# Patient Record
Sex: Male | Born: 1935 | Race: White | Hispanic: No | Marital: Married | Smoking: Former smoker
Health system: Southern US, Community
[De-identification: ages and names within clinical notes are randomized; demographics above are authoritative.]

## PROBLEM LIST (undated history)

## (undated) DIAGNOSIS — I4891 Unspecified atrial fibrillation: Secondary | ICD-10-CM

## (undated) DIAGNOSIS — F0390 Unspecified dementia without behavioral disturbance: Secondary | ICD-10-CM

## (undated) HISTORY — PX: SHOULDER SURGERY: SHX246

---

## 2004-09-25 ENCOUNTER — Ambulatory Visit: Payer: Self-pay | Admitting: Internal Medicine

## 2006-01-09 ENCOUNTER — Ambulatory Visit: Payer: Self-pay | Admitting: Unknown Physician Specialty

## 2006-10-01 ENCOUNTER — Ambulatory Visit: Payer: Self-pay | Admitting: Internal Medicine

## 2009-01-16 ENCOUNTER — Ambulatory Visit: Payer: Self-pay | Admitting: Orthopedic Surgery

## 2009-10-08 ENCOUNTER — Emergency Department: Payer: Self-pay | Admitting: Emergency Medicine

## 2010-03-16 ENCOUNTER — Emergency Department: Payer: Self-pay | Admitting: Emergency Medicine

## 2010-04-22 ENCOUNTER — Ambulatory Visit: Payer: Self-pay | Admitting: Unknown Physician Specialty

## 2010-05-15 ENCOUNTER — Encounter: Payer: Self-pay | Admitting: Unknown Physician Specialty

## 2010-05-31 ENCOUNTER — Ambulatory Visit: Payer: Self-pay | Admitting: Unknown Physician Specialty

## 2010-06-04 ENCOUNTER — Ambulatory Visit: Payer: Self-pay | Admitting: Unknown Physician Specialty

## 2010-06-06 ENCOUNTER — Encounter: Payer: Self-pay | Admitting: Internal Medicine

## 2010-06-07 ENCOUNTER — Ambulatory Visit: Payer: Self-pay | Admitting: Cardiovascular Disease

## 2010-06-13 ENCOUNTER — Encounter: Payer: Self-pay | Admitting: Internal Medicine

## 2010-07-13 ENCOUNTER — Encounter: Payer: Self-pay | Admitting: Internal Medicine

## 2010-08-13 ENCOUNTER — Encounter: Payer: Self-pay | Admitting: Internal Medicine

## 2010-08-26 ENCOUNTER — Encounter: Payer: Self-pay | Admitting: Unknown Physician Specialty

## 2010-09-12 ENCOUNTER — Encounter: Payer: Self-pay | Admitting: Unknown Physician Specialty

## 2010-10-13 ENCOUNTER — Encounter: Payer: Self-pay | Admitting: Unknown Physician Specialty

## 2010-11-13 ENCOUNTER — Encounter: Payer: Self-pay | Admitting: Unknown Physician Specialty

## 2010-12-12 ENCOUNTER — Encounter: Payer: Self-pay | Admitting: Unknown Physician Specialty

## 2011-01-12 ENCOUNTER — Encounter: Payer: Self-pay | Admitting: Unknown Physician Specialty

## 2011-02-11 ENCOUNTER — Encounter: Payer: Self-pay | Admitting: Unknown Physician Specialty

## 2011-03-14 ENCOUNTER — Encounter: Payer: Self-pay | Admitting: Unknown Physician Specialty

## 2011-04-13 ENCOUNTER — Encounter: Payer: Self-pay | Admitting: Unknown Physician Specialty

## 2011-05-14 ENCOUNTER — Encounter: Payer: Self-pay | Admitting: Unknown Physician Specialty

## 2011-06-23 ENCOUNTER — Ambulatory Visit: Payer: Self-pay | Admitting: Unknown Physician Specialty

## 2011-06-24 LAB — PATHOLOGY REPORT

## 2012-08-19 ENCOUNTER — Emergency Department: Payer: Self-pay | Admitting: Emergency Medicine

## 2012-08-19 LAB — CBC
HCT: 36.1 % — ABNORMAL LOW (ref 40.0–52.0)
MCHC: 35.8 g/dL (ref 32.0–36.0)
Platelet: 212 10*3/uL (ref 150–440)
RDW: 13.5 % (ref 11.5–14.5)
WBC: 9.7 10*3/uL (ref 3.8–10.6)

## 2012-08-19 LAB — URINALYSIS, COMPLETE
Bacteria: NONE SEEN
Ketone: NEGATIVE
Nitrite: NEGATIVE
Ph: 6 (ref 4.5–8.0)
Protein: NEGATIVE
RBC,UR: 1 /HPF (ref 0–5)
WBC UR: <1 /HPF (ref 0–5)

## 2012-08-19 LAB — COMPREHENSIVE METABOLIC PANEL
Albumin: 4 g/dL (ref 3.4–5.0)
Alkaline Phosphatase: 73 U/L (ref 50–136)
Calcium, Total: 8.8 mg/dL (ref 8.5–10.1)
Chloride: 105 mmol/L (ref 98–107)
Creatinine: 1.68 mg/dL — ABNORMAL HIGH (ref 0.60–1.30)
EGFR (African American): 43 — ABNORMAL LOW
Glucose: 101 mg/dL — ABNORMAL HIGH (ref 65–99)
SGOT(AST): 21 U/L (ref 15–37)
Sodium: 142 mmol/L (ref 136–145)
Total Protein: 7 g/dL (ref 6.4–8.2)

## 2012-08-19 LAB — TROPONIN I: Troponin-I: <0.02 ng/mL

## 2012-08-19 LAB — CK TOTAL AND CKMB (NOT AT ARMC): CK-MB: 0.8 ng/mL (ref 0.5–3.6)

## 2012-08-19 LAB — APTT: Activated PTT: 28.9 secs (ref 23.6–35.9)

## 2012-08-26 ENCOUNTER — Encounter: Payer: Self-pay | Admitting: Internal Medicine

## 2012-09-12 ENCOUNTER — Encounter: Payer: Self-pay | Admitting: Internal Medicine

## 2012-10-13 ENCOUNTER — Encounter: Payer: Self-pay | Admitting: Internal Medicine

## 2012-10-26 ENCOUNTER — Encounter: Payer: Self-pay | Admitting: Internal Medicine

## 2012-11-13 ENCOUNTER — Encounter: Payer: Self-pay | Admitting: Internal Medicine

## 2013-06-14 ENCOUNTER — Emergency Department: Payer: Self-pay | Admitting: Emergency Medicine

## 2013-06-14 LAB — COMPREHENSIVE METABOLIC PANEL
Albumin: 3.7 g/dL (ref 3.4–5.0)
Anion Gap: 4 — ABNORMAL LOW (ref 7–16)
BUN: 21 mg/dL — ABNORMAL HIGH (ref 7–18)
Calcium, Total: 9.4 mg/dL (ref 8.5–10.1)
EGFR (African American): 44 — ABNORMAL LOW
Glucose: 96 mg/dL (ref 65–99)
Osmolality: 277 (ref 275–301)
Potassium: 4.2 mmol/L (ref 3.5–5.1)
SGOT(AST): 24 U/L (ref 15–37)
SGPT (ALT): 21 U/L (ref 12–78)
Sodium: 137 mmol/L (ref 136–145)
Total Protein: 7 g/dL (ref 6.4–8.2)

## 2013-06-14 LAB — CBC
HCT: 39.4 % — ABNORMAL LOW (ref 40.0–52.0)
HGB: 13.8 g/dL (ref 13.0–18.0)
MCH: 31.6 pg (ref 26.0–34.0)
MCHC: 35.1 g/dL (ref 32.0–36.0)
RBC: 4.37 10*6/uL — ABNORMAL LOW (ref 4.40–5.90)
WBC: 9.2 10*3/uL (ref 3.8–10.6)

## 2013-06-14 LAB — URINALYSIS, COMPLETE
Bacteria: NONE SEEN
Bilirubin,UR: NEGATIVE
Glucose,UR: NEGATIVE mg/dL (ref 0–75)
Leukocyte Esterase: NEGATIVE
Nitrite: NEGATIVE
Ph: 8 (ref 4.5–8.0)
Protein: NEGATIVE
RBC,UR: 1 /HPF (ref 0–5)
Squamous Epithelial: NONE SEEN

## 2013-06-14 LAB — LIPASE, BLOOD: Lipase: 113 U/L (ref 73–393)

## 2013-06-27 ENCOUNTER — Other Ambulatory Visit: Payer: Self-pay | Admitting: Unknown Physician Specialty

## 2013-06-27 LAB — CLOSTRIDIUM DIFFICILE BY PCR

## 2013-07-15 ENCOUNTER — Other Ambulatory Visit: Payer: Self-pay | Admitting: Unknown Physician Specialty

## 2013-07-15 LAB — CLOSTRIDIUM DIFFICILE BY PCR

## 2014-08-23 ENCOUNTER — Emergency Department: Payer: Self-pay | Admitting: Emergency Medicine

## 2014-08-23 LAB — CBC WITH DIFFERENTIAL/PLATELET
Basophil #: 0 10*3/uL (ref 0.0–0.1)
Basophil %: 0.4 %
EOS PCT: 3.7 %
Eosinophil #: 0.3 10*3/uL (ref 0.0–0.7)
HCT: 38.5 % — ABNORMAL LOW (ref 40.0–52.0)
HGB: 12.7 g/dL — AB (ref 13.0–18.0)
LYMPHS PCT: 17.3 %
Lymphocyte #: 1.2 10*3/uL (ref 1.0–3.6)
MCH: 30.9 pg (ref 26.0–34.0)
MCHC: 32.9 g/dL (ref 32.0–36.0)
MCV: 94 fL (ref 80–100)
Monocyte #: 0.8 x10 3/mm (ref 0.2–1.0)
Monocyte %: 12.5 %
NEUTROS PCT: 66.1 %
Neutrophil #: 4.5 10*3/uL (ref 1.4–6.5)
PLATELETS: 176 10*3/uL (ref 150–440)
RBC: 4.1 10*6/uL — ABNORMAL LOW (ref 4.40–5.90)
RDW: 14.2 % (ref 11.5–14.5)
WBC: 6.8 10*3/uL (ref 3.8–10.6)

## 2014-08-23 LAB — URINALYSIS, COMPLETE
BILIRUBIN, UR: NEGATIVE
Bacteria: NONE SEEN
Blood: NEGATIVE
Glucose,UR: NEGATIVE mg/dL (ref 0–75)
KETONE: NEGATIVE
Leukocyte Esterase: NEGATIVE
Nitrite: NEGATIVE
Ph: 6 (ref 4.5–8.0)
Protein: NEGATIVE
RBC,UR: NONE SEEN /HPF (ref 0–5)
SPECIFIC GRAVITY: 1.019 (ref 1.003–1.030)
SQUAMOUS EPITHELIAL: NONE SEEN
WBC UR: <1 /HPF (ref 0–5)

## 2014-08-23 LAB — BASIC METABOLIC PANEL
ANION GAP: 6 — AB (ref 7–16)
BUN: 18 mg/dL (ref 7–18)
Calcium, Total: 8.1 mg/dL — ABNORMAL LOW (ref 8.5–10.1)
Chloride: 106 mmol/L (ref 98–107)
Co2: 28 mmol/L (ref 21–32)
Creatinine: 1.71 mg/dL — ABNORMAL HIGH (ref 0.60–1.30)
GFR CALC AF AMER: 49 — AB
GFR CALC NON AF AMER: 41 — AB
Glucose: 97 mg/dL (ref 65–99)
OSMOLALITY: 281 (ref 275–301)
Potassium: 4.5 mmol/L (ref 3.5–5.1)
Sodium: 140 mmol/L (ref 136–145)

## 2014-08-23 LAB — TROPONIN I

## 2015-03-19 DIAGNOSIS — I10 Essential (primary) hypertension: Secondary | ICD-10-CM | POA: Diagnosis present

## 2015-06-21 DIAGNOSIS — N183 Chronic kidney disease, stage 3 unspecified: Secondary | ICD-10-CM | POA: Diagnosis present

## 2016-02-01 ENCOUNTER — Other Ambulatory Visit: Payer: Self-pay | Admitting: Physician Assistant

## 2016-02-01 DIAGNOSIS — J189 Pneumonia, unspecified organism: Secondary | ICD-10-CM

## 2016-02-01 DIAGNOSIS — R9389 Abnormal findings on diagnostic imaging of other specified body structures: Secondary | ICD-10-CM

## 2016-07-15 ENCOUNTER — Encounter: Payer: Medicare Other | Attending: Physician Assistant | Admitting: Physician Assistant

## 2016-07-15 DIAGNOSIS — N183 Chronic kidney disease, stage 3 (moderate): Secondary | ICD-10-CM | POA: Insufficient documentation

## 2016-07-15 DIAGNOSIS — L89312 Pressure ulcer of right buttock, stage 2: Secondary | ICD-10-CM | POA: Insufficient documentation

## 2016-07-15 DIAGNOSIS — M1990 Unspecified osteoarthritis, unspecified site: Secondary | ICD-10-CM | POA: Insufficient documentation

## 2016-07-15 DIAGNOSIS — I129 Hypertensive chronic kidney disease with stage 1 through stage 4 chronic kidney disease, or unspecified chronic kidney disease: Secondary | ICD-10-CM | POA: Diagnosis not present

## 2016-07-15 DIAGNOSIS — L89322 Pressure ulcer of left buttock, stage 2: Secondary | ICD-10-CM | POA: Insufficient documentation

## 2016-07-18 NOTE — Progress Notes (Addendum)
TREVYON, SWOR ([id number]) Visit Report for 07/15/2016 Allergy List Details Patient Name: Mike Smith, Mike Smith. Date of Service: 07/15/2016 9:30 AM Medical Record Number: [id number] Patient Account Number: [id number] Date of Birth/Sex: 1928/01/05 (82 y.o. Male) Treating RN: Cornell Barman Primary Care Physician: Lynett Fish Other Clinician: Referring Physician: Lynett Fish Treating Physician/Extender: Melburn Hake, HOYT Weeks in Treatment: 0 Allergies Active Allergies Sulfa (Sulfonamide Antibiotics) Allergy Notes Electronic Signature(s) Signed: 07/22/2016 1:46:02 PM By: Gretta Cool, RN, BSN, Kim RN, BSN Previous Signature: 07/17/2016 4:28:52 PM Version By: Gretta Cool, RN, BSN, Kim RN, BSN Entered By: Gretta Cool, RN, BSN, Kim on 07/22/2016 13:26:39 Mike Smith ([id number]) -------------------------------------------------------------------------------- Arrival Information Details Patient Name: Mike Smith, Mike Smith. Date of Service: 07/15/2016 9:30 AM Medical Record Number: [id number] Patient Account Number: [id number] Date of Birth/Sex: May 25, 1928 (82 y.o. Male) Treating RN: Cornell Barman Primary Care Physician: Lynett Fish Other Clinician: Referring Physician: Lynett Fish Treating Physician/Extender: Melburn Hake, HOYT Weeks in Treatment: 0 Visit Information Patient Arrived: Lyndel Pleasure Time: 09:39 Accompanied By: son, Transfer Assistance: None Patient Identification Verified: Yes Secondary Verification Process Yes Completed: Patient Has Alerts: Yes Patient Alerts: NOT Diabetic Electronic Signature(s) Signed: 07/22/2016 1:46:02 PM By: Gretta Cool, RN, BSN, Kim RN, BSN Previous Signature: 07/17/2016 4:28:52 PM Version By: Gretta Cool, RN, BSN, Kim RN, BSN Entered By: Gretta Cool, RN, BSN, Kim on 07/22/2016 13:25:57 Mike Smith ([id number]) -------------------------------------------------------------------------------- Clinic Level of Care Assessment Details Patient Name:  Mike Smith, Mike Smith. Date of Service: 07/15/2016 9:30 AM Medical Record Number: [id number] Patient Account Number: [id number] Date of Birth/Sex: [birth year] (82 y.o. Male) Treating RN: Cornell Barman Primary Care Physician: Lynett Fish Other Clinician: Referring Physician: Lynett Fish Treating Physician/Extender: Melburn Hake, HOYT Weeks in Treatment: 0 Clinic Level of Care Assessment Items TOOL 2 Quantity Score '[]'$  - Use when only an EandM is performed on the INITIAL visit 0 ASSESSMENTS - Nursing Assessment / Reassessment X - General Physical Exam (combine w/ comprehensive assessment (listed just 1 20 below) when performed on new pt. evals) X - Comprehensive Assessment (HX, ROS, Risk Assessments, Wounds Hx, etc.) 1 25 ASSESSMENTS - Wound and Skin Assessment / Reassessment X - Simple Wound Assessment / Reassessment - one wound 1 5 '[]'$  - Complex Wound Assessment / Reassessment - multiple wounds 0 '[]'$  - Dermatologic / Skin Assessment (not related to wound area) 0 ASSESSMENTS - Ostomy and/or Continence Assessment and Care '[]'$  - Incontinence Assessment and Management 0 '[]'$  - Ostomy Care Assessment and Management (repouching, etc.) 0 PROCESS - Coordination of Care X - Simple Patient / Family Education for ongoing care 1 15 '[]'$  - Complex (extensive) Patient / Family Education for ongoing care 0 X - Staff obtains Programmer, systems, Records, Test Results / Process Orders 1 10 '[]'$  - Staff telephones HHA, Nursing Homes / Clarify orders / etc 0 '[]'$  - Routine Transfer to another Facility (non-emergent condition) 0 '[]'$  - Routine Hospital Admission (non-emergent condition) 0 '[]'$  - New Admissions / Biomedical engineer / Ordering NPWT, Apligraf, etc. 0 '[]'$  - Emergency Hospital Admission (emergent condition) 0 X - Simple Discharge Coordination 1 10 ORLANDIS, SANDEN. ([id number]) '[]'$  - Complex (extensive) Discharge Coordination 0 PROCESS - Special Needs '[]'$  - Pediatric / Minor Patient Management 0 '[]'$  -  Isolation Patient Management 0 '[]'$  - Hearing / Language / Visual special needs 0 '[]'$  - Assessment of Community assistance (transportation, D/C planning, etc.) 0 '[]'$  - Additional assistance / Altered mentation 0 '[]'$  - Support Surface(s) Assessment (bed, cushion, seat, etc.) 0 INTERVENTIONS - Wound  Cleansing / Measurement X - Wound Imaging (photographs - any number of wounds) 1 5 '[]'$  - Wound Tracing (instead of photographs) 0 '[]'$  - Simple Wound Measurement - one wound 0 X - Complex Wound Measurement - multiple wounds 2 5 '[]'$  - Simple Wound Cleansing - one wound 0 X - Complex Wound Cleansing - multiple wounds 2 5 INTERVENTIONS - Wound Dressings '[]'$  - Small Wound Dressing one or multiple wounds 0 X - Medium Wound Dressing one or multiple wounds 2 15 '[]'$  - Large Wound Dressing one or multiple wounds 0 '[]'$  - Application of Medications - injection 0 INTERVENTIONS - Miscellaneous '[]'$  - External ear exam 0 '[]'$  - Specimen Collection (cultures, biopsies, blood, body fluids, etc.) 0 '[]'$  - Specimen(s) / Culture(s) sent or taken to Lab for analysis 0 '[]'$  - Patient Transfer (multiple staff / Civil Service fast streamer / Similar devices) 0 '[]'$  - Simple Staple / Suture removal (25 or less) 0 '[]'$  - Complex Staple / Suture removal (26 or more) 0 KARANVEER, RAMAKRISHNAN M. ([id number]) '[]'$  - Hypo / Hyperglycemic Management (close monitor of Blood Glucose) 0 '[]'$  - Ankle / Brachial Index (ABI) - do not check if billed separately 0 Has the patient been seen at the hospital within the last three years: Yes Total Score: 140 Level Of Care: New/Established - Level 4 Electronic Signature(s) Signed: 07/17/2016 4:28:52 PM By: Gretta Cool, RN, BSN, Kim RN, BSN Entered By: Gretta Cool, RN, BSN, Kim on 07/15/2016 10:23:51 Mike Smith ([id number]) -------------------------------------------------------------------------------- Encounter Discharge Information Details Patient Name: Mike Smith, Mike Smith. Date of Service: 07/15/2016 9:30 AM Medical Record Number:  [id number] Patient Account Number: [id number] Date of Birth/Sex: 1928-04-18 (82 y.o. Male) Treating RN: Cornell Barman Primary Care Physician: Lynett Fish Other Clinician: Referring Physician: Lynett Fish Treating Physician/Extender: Melburn Hake, HOYT Weeks in Treatment: 0 Encounter Discharge Information Items Discharge Pain Level: 0 Discharge Condition: Stable Ambulatory Status: Cane Discharge Destination: Home Transportation: Other Accompanied By: son Schedule Follow-up Appointment: Yes Medication Reconciliation completed Yes and provided to Patient/Care Christle Nolting: Provided on Clinical Summary of Care: 07/15/2016 Form Type Recipient Paper Patient DF Electronic Signature(s) Signed: 07/15/2016 10:37:28 AM By: Ruthine Dose Entered By: Ruthine Dose on 07/15/2016 10:37:27 Mike Smith ([id number]) -------------------------------------------------------------------------------- Multi Wound Chart Details Patient Name: Mike Smith. Date of Service: 07/15/2016 9:30 AM Medical Record Number: [id number] Patient Account Number: [id number] Date of Birth/Sex: September 27, 1928 (82 y.o. Male) Treating RN: Cornell Barman Primary Care Physician: Lynett Fish Other Clinician: Referring Physician: Lynett Fish Treating Physician/Extender: Melburn Hake, HOYT Weeks in Treatment: 0 Vital Signs Height(in): 72 Pulse(bpm): 79 Weight(lbs): 224 Blood Pressure 125/69 (mmHg): Body Mass Index(BMI): 30 Temperature(F): 98.2 Respiratory Rate 16 (breaths/min): Photos: [N/A:N/A] Wound Location: Right Gluteus Left Gluteus N/A Wounding Event: Gradually Appeared Pressure Injury N/A Primary Etiology: Pressure Ulcer Pressure Ulcer N/A Comorbid History: Cataracts, Osteoarthritis Cataracts, Osteoarthritis N/A Date Acquired: 02/25/2016 02/25/2016 N/A Weeks of Treatment: 0 0 N/A Wound Status: Open Open N/A Measurements L x W x D 0.6x0.5x0.1 2x1x0.1 N/A (cm) Area (cm) : 0.236 1.571  N/A Volume (cm) : 0.024 0.157 N/A % Reduction in Area: 0.00% 0.00% N/A % Reduction in Volume: 0.00% 0.00% N/A Classification: Category/Stage II Category/Stage II N/A Exudate Amount: Small Small N/A Exudate Type: Serosanguineous Serous N/A Exudate Color: red, brown amber N/A Wound Margin: Flat and Intact Flat and Intact N/A Granulation Amount: Small (1-33%) Small (1-33%) N/A Granulation Quality: Pink Pink N/A Necrotic Amount: Large (67-100%) Large (67-100%) N/A Necrotic Tissue: N/A Eschar N/A Exposed Structures: Fascia: No  Fascia: No N/A Fat: No Fat: No Mike Smith, Mike Smith ([id number]) Tendon: No Tendon: No Muscle: No Muscle: No Joint: No Joint: No Bone: No Bone: No Limited to Skin Limited to Skin Breakdown Breakdown Epithelialization: N/A Small (1-33%) N/A Periwound Skin Texture: Scarring: Yes Edema: No N/A Edema: No Excoriation: No Excoriation: No Induration: No Induration: No Callus: No Callus: No Crepitus: No Crepitus: No Fluctuance: No Fluctuance: No Friable: No Friable: No Rash: No Rash: No Scarring: No Periwound Skin Maceration: No Maceration: No N/A Moisture: Moist: No Moist: No Dry/Scaly: No Dry/Scaly: No Periwound Skin Color: Atrophie Blanche: No Atrophie Blanche: No N/A Cyanosis: No Cyanosis: No Ecchymosis: No Ecchymosis: No Erythema: No Erythema: No Hemosiderin Staining: No Hemosiderin Staining: No Mottled: No Mottled: No Pallor: No Pallor: No Rubor: No Rubor: No Tenderness on No No N/A Palpation: Wound Preparation: Ulcer Cleansing: Ulcer Cleansing: N/A Rinsed/Irrigated with Rinsed/Irrigated with Saline Saline Topical Anesthetic Topical Anesthetic Applied: None Applied: None Treatment Notes Electronic Signature(s) Signed: 07/17/2016 4:28:52 PM By: Gretta Cool, RN, BSN, Kim RN, BSN Entered By: Gretta Cool, RN, BSN, Kim on 07/15/2016 10:17:57 Mike Smith  ([id number]) -------------------------------------------------------------------------------- [place] Details Patient Name: Mike Smith, Mike Smith. Date of Service: 07/15/2016 9:30 AM Medical Record Number: [id number] Patient Account Number: [id number] Date of Birth/Sex: 1927/11/19 (82 y.o. Male) Treating RN: Cornell Barman Primary Care Physician: Lynett Fish Other Clinician: Referring Physician: Lynett Fish Treating Physician/Extender: Melburn Hake, HOYT Weeks in Treatment: 0 Active Inactive Abuse / Safety / Falls / Self Care Management Nursing Diagnoses: Impaired physical mobility Potential for falls Goals: Patient will remain injury free Date Initiated: 07/15/2016 Goal Status: Active Interventions: Assess fall risk on admission and as needed Notes: Orientation to the Wound Care Program Nursing Diagnoses: Knowledge deficit related to the wound healing center program Goals: Patient/caregiver will verbalize understanding of the [place] Program Date Initiated: 07/15/2016 Goal Status: Active Interventions: Provide education on orientation to the wound center Notes: Pressure Nursing Diagnoses: Knowledge deficit related to management of pressures ulcers Potential for impaired tissue integrity related to pressure, friction, moisture, and shear Goals: LEONIDUS, ROWAND ([id number]) Patient will remain free of pressure ulcers Date Initiated: 07/15/2016 Goal Status: Active Interventions: Assess: immobility, friction, shearing, incontinence upon admission and as needed Notes: Wound/Skin Impairment Nursing Diagnoses: Impaired tissue integrity Goals: Patient/caregiver will verbalize understanding of skin care regimen Date Initiated: 07/15/2016 Goal Status: Active Interventions: Assess patient/caregiver ability to obtain necessary supplies Notes: Electronic Signature(s) Signed: 07/17/2016 4:28:52 PM By: Gretta Cool, RN, BSN, Kim RN, BSN Entered By:  Gretta Cool, RN, BSN, Kim on 07/15/2016 10:17:41 Mike Smith ([id number]) -------------------------------------------------------------------------------- Pain Assessment Details Patient Name: Mike Smith, Mike Smith. Date of Service: 07/15/2016 9:30 AM Medical Record Number: [id number] Patient Account Number: [id number] Date of Birth/Sex: July 17, 1928 (82 y.o. Male) Treating RN: Cornell Barman Primary Care Physician: Lynett Fish Other Clinician: Referring Physician: Lynett Fish Treating Physician/Extender: Melburn Hake, HOYT Weeks in Treatment: 0 Active Problems Location of Pain Severity and Description of Pain Patient Has Paino No Site Locations With Dressing Change: No Duration of the Pain. Constant / Intermittento Intermittent Pain Management and Medication Current Pain Management: Electronic Signature(s) Signed: 07/22/2016 1:46:02 PM By: Gretta Cool, RN, BSN, Kim RN, BSN Previous Signature: 07/17/2016 4:28:52 PM Version By: Gretta Cool, RN, BSN, Kim RN, BSN Entered By: Gretta Cool, RN, BSN, Kim on 07/22/2016 13:26:04 Mike Smith ([id number]) -------------------------------------------------------------------------------- Patient/Caregiver Education Details Patient Name: LOMAX, Mike Smith. Date of Service: 07/15/2016 9:30 AM Medical Record Number: [id number] Patient Account Number: [id number] Date of  Birth/Gender: [birth year] (83 y.o. Male) Treating RN: Cornell Barman Primary Care Physician: Lynett Fish Other Clinician: Referring Physician: Lynett Fish Treating Physician/Extender: Sharalyn Ink in Treatment: 0 Education Assessment Education Provided To: Patient Education Topics Provided Offloading: Handouts: What is Offloadingo Methods: Demonstration Responses: State content correctly Wound/Skin Impairment: Handouts: Caring for Your Ulcer Methods: Demonstration Responses: State content correctly Electronic Signature(s) Signed: 07/17/2016 4:28:52 PM By: Gretta Cool, RN,  BSN, Kim RN, BSN Entered By: Gretta Cool, RN, BSN, Kim on 07/15/2016 10:25:12 Mike Smith ([id number]) -------------------------------------------------------------------------------- Wound Assessment Details Patient Name: Mike Smith, Mike Smith. Date of Service: 07/15/2016 9:30 AM Medical Record Number: [id number] Patient Account Number: [id number] Date of Birth/Sex: 03-Jan-1928 (81 y.o. Male) Treating RN: Cornell Barman Primary Care Physician: Lynett Fish Other Clinician: Referring Physician: Lynett Fish Treating Physician/Extender: Melburn Hake, HOYT Weeks in Treatment: 0 Wound Status Wound Number: 1 Primary Etiology: Pressure Ulcer Wound Location: Right Gluteus Wound Status: Open Wounding Event: Gradually Appeared Comorbid History: Cataracts, Osteoarthritis Date Acquired: 02/25/2016 Weeks Of Treatment: 0 Clustered Wound: No Photos Wound Measurements Length: (cm) 0.6 Width: (cm) 0.5 Depth: (cm) 0.1 Area: (cm) 0.236 Volume: (cm) 0.024 % Reduction in Area: 0% % Reduction in Volume: 0% Wound Description Classification: Category/Stage II Wound Margin: Flat and Intact Exudate Amount: Medium Exudate Type: Serosanguineous Exudate Color: red, brown Wound Bed Granulation Amount: Small (1-33%) Exposed Structure Granulation Quality: Pink Fascia Exposed: No Necrotic Amount: Large (67-100%) Fat Layer Exposed: No Tendon Exposed: No Muscle Exposed: No Joint Exposed: No Bone Exposed: No Mike Smith, Mike Smith ([id number]) Limited to Skin Breakdown Periwound Skin Texture Texture Color No Abnormalities Noted: No No Abnormalities Noted: No Callus: No Atrophie Blanche: No Crepitus: No Cyanosis: No Excoriation: No Ecchymosis: No Fluctuance: No Erythema: No Friable: No Hemosiderin Staining: No Induration: No Mottled: No Localized Edema: No Pallor: No Rash: No Rubor: No Scarring: Yes Moisture No Abnormalities Noted: No Dry / Scaly: No Maceration: No Moist:  No Wound Preparation Ulcer Cleansing: Rinsed/Irrigated with Saline Topical Anesthetic Applied: None Treatment Notes Wound #1 (Right Gluteus) 1. Cleansed with: Clean wound with Normal Saline 2. Anesthetic Topical Lidocaine 4% cream to wound bed prior to debridement 4. Dressing Applied: Hydrogel 5. Secondary Dressing Applied Bordered Foam Dressing Electronic Signature(s) Signed: 07/17/2016 4:28:52 PM By: Gretta Cool, RN, BSN, Kim RN, BSN Entered By: Gretta Cool, RN, BSN, Kim on 07/17/2016 11:44:17 Mike Smith ([id number]) -------------------------------------------------------------------------------- Wound Assessment Details Patient Name: Mike Smith, Mike Smith. Date of Service: 07/15/2016 9:30 AM Medical Record Number: [id number] Patient Account Number: [id number] Date of Birth/Sex: May 01, 1928 (82 y.o. Male) Treating RN: Cornell Barman Primary Care Physician: Lynett Fish Other Clinician: Referring Physician: Lynett Fish Treating Physician/Extender: Melburn Hake, HOYT Weeks in Treatment: 0 Wound Status Wound Number: 2 Primary Etiology: Pressure Ulcer Wound Location: Left Gluteus Wound Status: Open Wounding Event: Pressure Injury Comorbid History: Cataracts, Osteoarthritis Date Acquired: 02/25/2016 Weeks Of Treatment: 0 Clustered Wound: No Photos Wound Measurements Length: (cm) 2 Width: (cm) 1 Depth: (cm) 0.1 Area: (cm) 1.571 Volume: (cm) 0.157 % Reduction in Area: 0% % Reduction in Volume: 0% Epithelialization: Small (1-33%) Tunneling: No Undermining: No Wound Description Classification: Category/Stage II Wound Margin: Flat and Intact Exudate Amount: Small Exudate Type: Serous Exudate Color: amber Wound Bed Granulation Amount: Small (1-33%) Exposed Structure Granulation Quality: Pink Fascia Exposed: No Necrotic Amount: Large (67-100%) Fat Layer Exposed: No Necrotic Quality: Eschar Tendon Exposed: No Muscle Exposed: No Joint Exposed: No Bone Exposed:  No PRANAY, Mike Smith ([id number]) Limited to Skin Breakdown Periwound  Skin Texture Texture Color No Abnormalities Noted: No No Abnormalities Noted: No Callus: No Atrophie Blanche: No Crepitus: No Cyanosis: No Excoriation: No Ecchymosis: No Fluctuance: No Erythema: No Friable: No Hemosiderin Staining: No Induration: No Mottled: No Localized Edema: No Pallor: No Rash: No Rubor: No Scarring: No Moisture No Abnormalities Noted: No Dry / Scaly: No Maceration: No Moist: No Wound Preparation Ulcer Cleansing: Rinsed/Irrigated with Saline Topical Anesthetic Applied: None Treatment Notes Wound #2 (Left Gluteus) 1. Cleansed with: Clean wound with Normal Saline 2. Anesthetic Topical Lidocaine 4% cream to wound bed prior to debridement 4. Dressing Applied: Hydrogel 5. Secondary Dressing Applied Bordered Foam Dressing Electronic Signature(s) Signed: 07/17/2016 4:28:52 PM By: Gretta Cool, RN, BSN, Kim RN, BSN Entered By: Gretta Cool, RN, BSN, Kim on 07/15/2016 10:15:15 Mike Smith ([id number]) -------------------------------------------------------------------------------- [place] Details Patient Name: WILLIS, HOLQUIN. Date of Service: 07/15/2016 9:30 AM Medical Record Number: [id number] Patient Account Number: [id number] Date of Birth/Sex: 1928-07-23 (82 y.o. Male) Treating RN: Cornell Barman Primary Care Physician: Lynett Fish Other Clinician: Referring Physician: Lynett Fish Treating Physician/Extender: Melburn Hake, HOYT Weeks in Treatment: 0 Vital Signs Time Taken: 09:41 Temperature (F): 98.2 Height (in): 72 Pulse (bpm): 79 Weight (lbs): 224 Respiratory Rate (breaths/min): 16 Body Mass Index (BMI): 30.4 Blood Pressure (mmHg): 125/69 Reference Range: 80 - 120 mg / dl Electronic Signature(s) Signed: 07/22/2016 1:46:02 PM By: Gretta Cool, RN, BSN, Kim RN, BSN Previous Signature: 07/17/2016 4:28:52 PM Version By: Gretta Cool, RN, BSN, Kim RN, BSN Entered By: Gretta Cool, RN,  BSN, Kim on 07/22/2016 13:26:10

## 2016-07-18 NOTE — Progress Notes (Addendum)
SHIGEO, BAUGH ([id number]) Visit Report for 07/15/2016 Abuse/Suicide Risk Screen Details Patient Name: Mike Smith, Mike Smith. Date of Service: 07/15/2016 9:30 AM Medical Record Number: [id number] Patient Account Number: [id number] Date of Birth/Sex: [birth year] (82 y.o. Male) Treating RN: Cornell Barman Primary Care Physician: Lynett Fish Other Clinician: Referring Physician: Lynett Fish Treating Physician/Extender: Melburn Hake, HOYT Weeks in Treatment: 0 Abuse/Suicide Risk Screen Items Answer ABUSE/SUICIDE RISK SCREEN: Has anyone close to you tried to hurt or harm you recentlyo No Do you feel uncomfortable with anyone in your familyo No Has anyone forced you do things that you didnot want to doo No Do you have any thoughts of harming yourselfo No Patient displays signs or symptoms of abuse and/or neglect. No Electronic Signature(s) Signed: 07/22/2016 1:46:02 PM By: Gretta Cool, RN, BSN, Kim RN, BSN Previous Signature: 07/17/2016 4:28:52 PM Version By: Gretta Cool, RN, BSN, Kim RN, BSN Entered By: Gretta Cool, RN, BSN, Kim on 07/22/2016 13:26:58 Mike Smith ([id number]) -------------------------------------------------------------------------------- Activities of Daily Living Details Patient Name: Mike Smith, Mike Smith. Date of Service: 07/15/2016 9:30 AM Medical Record Number: [id number] Patient Account Number: [id number] Date of Birth/Sex: 02-May-1928 (82 y.o. Male) Treating RN: Cornell Barman Primary Care Physician: Lynett Fish Other Clinician: Referring Physician: Lynett Fish Treating Physician/Extender: Melburn Hake, HOYT Weeks in Treatment: 0 Activities of Daily Living Items Answer Activities of Daily Living (Please select one for each item) Drive Automobile Completely Able Take Medications Completely Able Use Telephone Completely Able Care for Appearance Completely Able Use Toilet Completely Able Bath / Shower Completely Able Dress Self Completely Able Feed Self Completely  Able Walk Completely Able Get In / Out Bed Completely Able Housework Need Assistance Prepare Meals Need Assistance Handle Money Completely Able Shop for Self Need Assistance Electronic Signature(s) Signed: 07/22/2016 1:46:02 PM By: Gretta Cool, RN, BSN, Kim RN, BSN Previous Signature: 07/17/2016 4:28:52 PM Version By: Gretta Cool, RN, BSN, Kim RN, BSN Entered By: Gretta Cool, RN, BSN, Kim on 07/22/2016 13:27:05 Mike Smith ([id number]) -------------------------------------------------------------------------------- Education Assessment Details Patient Name: Mike Smith, Mike Smith. Date of Service: 07/15/2016 9:30 AM Medical Record Number: [id number] Patient Account Number: [id number] Date of Birth/Sex: Dec 26, 1927 (82 y.o. Male) Treating RN: Cornell Barman Primary Care Physician: Lynett Fish Other Clinician: Referring Physician: Lynett Fish Treating Physician/Extender: Sharalyn Ink in Treatment: 0 Primary Learner Assessed: Patient Learning Preferences/Education Level/Primary Language Learning Preference: Explanation, Demonstration Highest Education Level: College or Above Preferred Language: English Cognitive Barrier Assessment/Beliefs Language Barrier: No Translator Needed: No Memory Deficit: No Emotional Barrier: No Cultural/Religious Beliefs Affecting Medical No Care: Physical Barrier Assessment Impaired Vision: Yes Glasses Impaired Hearing: No Decreased Hand dexterity: No Knowledge/Comprehension Assessment Knowledge Level: High Comprehension Level: High Ability to understand written High instructions: Ability to understand verbal High instructions: Motivation Assessment Anxiety Level: Calm Cooperation: Cooperative Education Importance: Acknowledges Need Interest in Health Problems: Asks Questions Perception: Coherent Willingness to Engage in Self- High Management Activities: Readiness to Engage in Self- High Management Activities: Electronic  Signature(s) Mike Smith, Mike Smith ([id number]) Signed: 07/22/2016 1:46:02 PM By: Gretta Cool RN, BSN, Kim RN, BSN Previous Signature: 07/17/2016 4:28:52 PM Version By: Gretta Cool, RN, BSN, Kim RN, BSN Entered By: Gretta Cool, RN, BSN, Kim on 07/22/2016 13:27:12 Mike Smith, Mike Smith ([id number]) -------------------------------------------------------------------------------- Fall Risk Assessment Details Patient Name: Mike Smith, Mike Smith. Date of Service: 07/15/2016 9:30 AM Medical Record Number: [id number] Patient Account Number: [id number] Date of Birth/Sex: 02/27/1928 (82 y.o. Male) Treating RN: Cornell Barman Primary Care Physician: Lynett Fish Other Clinician: Referring Physician: Lynett Fish Treating  Physician/Extender: Melburn Hake, HOYT Weeks in Treatment: 0 Fall Risk Assessment Items Have you had 2 or more falls in the last 12 monthso 0 Yes Have you had any fall that resulted in injury in the last 12 monthso 0 No FALL RISK ASSESSMENT: History of falling - immediate or within 3 months 25 Yes Secondary diagnosis 0 No Ambulatory aid None/bed rest/wheelchair/nurse 0 No Crutches/cane/walker 15 Yes Furniture 0 No IV Access/Saline Lock 0 No Gait/Training Normal/bed rest/immobile 0 No Weak 10 Yes Impaired 0 No Mental Status Oriented to own ability 0 Yes Electronic Signature(s) Signed: 07/22/2016 1:46:02 PM By: Gretta Cool, RN, BSN, Kim RN, BSN Previous Signature: 07/17/2016 4:28:52 PM Version By: Gretta Cool, RN, BSN, Kim RN, BSN Entered By: Gretta Cool, RN, BSN, Kim on 07/22/2016 13:27:18 Mike Smith ([id number]) -------------------------------------------------------------------------------- Nutrition Risk Assessment Details Patient Name: Mike Smith, Mike Smith. Date of Service: 07/15/2016 9:30 AM Medical Record Number: [id number] Patient Account Number: [id number] Date of Birth/Sex: 1927/11/24 (82 y.o. Male) Treating RN: Cornell Barman Primary Care Physician: Lynett Fish Other Clinician: Referring  Physician: Lynett Fish Treating Physician/Extender: Melburn Hake, HOYT Weeks in Treatment: 0 Height (in): 72 Weight (lbs): 224 Body Mass Index (BMI): 30.4 Nutrition Risk Assessment Items NUTRITION RISK SCREEN: I have an illness or condition that made me change the kind and/or 0 No amount of food I eat I eat fewer than two meals per day 0 No I eat few fruits and vegetables, or milk products 0 No I have three or more drinks of beer, liquor or wine almost every day 0 No I have tooth or mouth problems that make it hard for me to eat 0 No I don't always have enough money to buy the food I need 0 No I eat alone most of the time 0 No I take three or more different prescribed or over-the-counter drugs a 1 Yes day Without wanting to, I have lost or gained 10 pounds in the last six 0 No months I am not always physically able to shop, cook and/or feed myself 0 No Nutrition Protocols Good Risk Protocol 0 No interventions needed Moderate Risk Protocol Electronic Signature(s) Signed: 07/22/2016 1:46:02 PM By: Gretta Cool, RN, BSN, Kim RN, BSN Previous Signature: 07/17/2016 4:28:52 PM Version By: Gretta Cool, RN, BSN, Kim RN, BSN Entered By: Gretta Cool, RN, BSN, Kim on 07/22/2016 13:27:26

## 2016-07-23 ENCOUNTER — Ambulatory Visit: Payer: Medicare Other | Admitting: Physician Assistant

## 2016-07-24 NOTE — Progress Notes (Signed)
Mike Smith ([id number]) Visit Report for 07/15/2016 Chief Complaint Document Details Patient Name: Mike Smith, Mike Smith. Date of Service: 07/15/2016 9:30 AM Medical Record Number: [id number] Patient Account Number: [id number] Date of Birth/Sex: [birth year] (82 y.o. Male) Treating RN: Mike Smith Primary Care Physician: Mike Smith Other Clinician: Referring Physician: Lynett Smith Treating Physician/Extender: Mike Smith Weeks in Treatment: 0 Information Obtained from: Patient Chief Complaint bilateral gluteal pressure ulcers Electronic Signature(s) Signed: 07/22/2016 1:46:02 PM By: Gretta Cool RN, BSN, Kim RN, BSN Signed: 07/23/2016 4:52:13 PM By: Worthy Keeler PA-C Previous Signature: 07/17/2016 1:37:32 AM Version By: Worthy Keeler PA-C Entered By: Gretta Cool RN, BSN, Kim on 07/22/2016 13:29:14 Mike Smith ([id number]) -------------------------------------------------------------------------------- HPI Details Patient Name: Mike Smith, Mike Smith. Date of Service: 07/15/2016 9:30 AM Medical Record Number: [id number] Patient Account Number: [id number] Date of Birth/Sex: June 19, 1928 (82 y.o. Male) Treating RN: Mike Smith Primary Care Physician: Mike Smith Other Clinician: Referring Physician: Lynett Smith Treating Physician/Extender: Mike Smith Weeks in Treatment: 0 History of Present Illness Location: Bilateral gluteal region Quality: Patient describes the pain as a sore feeling when he applies pressure to the gluteal region. Severity: 3 out of 10 Duration: Patient tells me this is been present for longer than a year and that the ulcers tend to open and close over time. Timing: Pain occurs mainly with manipulation of the wound and specifically with sitting in certain positions that apply pressure to the wound did region. Context: Gradually as result of patient's sitting in his recliner extended periods of time. Modifying Factors: Up to this point  patient has been using benzoin spray and the wound has mainly been left open to air at this point. Associated Signs and Symptoms: Patient has chronic renal failure stage III, cataracts, a basal cell carcinoma which has been removed from his right ear. HPI Description: 07/15/16 Patient presents today for initial evaluation of the wound center concerning bilateral gluteal pressure ulcers which she tells me are giving him trouble at this point in time. This was noted by his primary care provider who then referred him to [place]. Overall he has not really seen anyone from a wound care perspective up to this point. fortunately he is able to get up and move on his own however during shifting and moving he does experience some sheer friction to the gluteal region which I believe may be contributing to this as well. Electronic Signature(s) Signed: 07/22/2016 1:46:02 PM By: Gretta Cool RN, BSN, Kim RN, BSN Signed: 07/23/2016 4:52:13 PM By: Worthy Keeler PA-C Previous Signature: 07/17/2016 1:37:32 AM Version By: Worthy Keeler PA-C Entered By: Gretta Cool RN, BSN, Kim on 07/22/2016 13:29:21 Mike Smith ([id number]) -------------------------------------------------------------------------------- Physical Exam Details Patient Name: Mike Smith, Mike Smith. Date of Service: 07/15/2016 9:30 AM Medical Record Number: [id number] Patient Account Number: [id number] Date of Birth/Sex: 1928-10-05 (82 y.o. Male) Treating RN: Mike Smith Primary Care Physician: Mike Smith Other Clinician: Referring Physician: Lynett Smith Treating Physician/Extender: Mike Smith Weeks in Treatment: 0 Constitutional supine blood pressure is within target range for patient.. pulse regular and within target range for patient.Marland Kitchen respirations regular, non-labored and within target range for patient.. Well-nourished and well-hydrated in no acute distress. Eyes conjunctiva clear no eyelid edema noted. pupils equal round and  reactive to light and accommodation. Ears, Nose, Mouth, and Throat no gross abnormality of ear auricles or external auditory canals. patient has hearing loss. mucus membranes moist. Respiratory normal breathing without difficulty. clear to auscultation bilaterally. Cardiovascular  regular rate and rhythm with normal S1, S2. no clubbing, cyanosis, edema, <3 sec cap refill. Gastrointestinal (GI) soft, non-tender, non-distended, +BS. no ventral hernia noted. Musculoskeletal normal gait and posture. no significant deformity or arthritic changes, no loss or range of motion, no clubbing. Psychiatric this patient is able to make decisions and demonstrates good insight into disease process. Alert and Oriented x 3. pleasant and cooperative. Electronic Signature(s) Signed: 07/22/2016 1:46:02 PM By: Gretta Cool RN, BSN, Kim RN, BSN Signed: 07/23/2016 4:52:13 PM By: Worthy Keeler PA-C Previous Signature: 07/17/2016 1:37:32 AM Version By: Worthy Keeler PA-C Entered By: Gretta Cool RN, BSN, Kim on 07/22/2016 13:29:33 Mike Smith ([id number]) -------------------------------------------------------------------------------- Physician Orders Details Patient Name: Mike Smith, Mike Smith. Date of Service: 07/15/2016 9:30 AM Medical Record Number: [id number] Patient Account Number: [id number] Date of Birth/Sex: 1928/05/22 (82 y.o. Male) Treating RN: Mike Smith Primary Care Physician: Mike Smith Other Clinician: Referring Physician: Lynett Smith Treating Physician/Extender: Mike Smith Weeks in Treatment: 0 Verbal / Phone Orders: Yes Clinician: Cornell Smith Read Back and Verified: Yes Diagnosis Coding Wound Cleansing Wound #1 Right Gluteus o Clean wound with Normal Saline. Wound #2 Left Gluteus o Clean wound with Normal Saline. Primary Wound Dressing Wound #1 Right Gluteus o Other: - Hydrogel Wound #2 Left Gluteus o Other: - Hydrogel Secondary Dressing Wound #1 Right  Gluteus o Boardered Foam Dressing Wound #2 Left Gluteus o Boardered Foam Dressing Dressing Change Frequency Wound #1 Right Gluteus o Change Dressing Monday, Wednesday, Friday Wound #2 Left Gluteus o Change Dressing Monday, Wednesday, Friday Follow-up Appointments Wound #1 Right Gluteus o Return Appointment in 1 week. Wound #2 Left Gluteus o Return Appointment in 1 week. Off-Loading JAYMIE, MISCH ([id number]) Wound #1 Right Gluteus o Turn and reposition every 2 hours - Move to different locations Wound #2 Left Gluteus o Turn and reposition every 2 hours - Move to different locations Additional Orders / Instructions Wound #1 Right Gluteus o Increase protein intake. Wound #2 Left Gluteus o Increase protein intake. Electronic Signature(s) Signed: 07/17/2016 1:37:32 AM By: Worthy Keeler PA-C Signed: 07/17/2016 4:28:52 PM By: Gretta Cool RN, BSN, Kim RN, BSN Entered By: Gretta Cool, RN, BSN, Kim on 07/15/2016 10:22:58 Mike Smith ([id number]) -------------------------------------------------------------------------------- Problem List Details Patient Name: Mike Smith, Mike Smith. Date of Service: 07/15/2016 9:30 AM Medical Record Number: [id number] Patient Account Number: [id number] Date of Birth/Sex: 1928-01-25 (82 y.o. Male) Treating RN: Mike Smith Primary Care Physician: Mike Smith Other Clinician: Referring Physician: Lynett Smith Treating Physician/Extender: Mike Smith Weeks in Treatment: 0 Active Problems ICD-10 Encounter Code Description Active Date Diagnosis L89.312 Pressure ulcer of right buttock, stage 2 07/15/2016 Yes L89.322 Pressure ulcer of left buttock, stage 2 07/15/2016 Yes N18.3 Chronic kidney disease, stage 3 (moderate) 07/15/2016 Yes Inactive Problems Resolved Problems Electronic Signature(s) Signed: 07/22/2016 1:46:02 PM By: Gretta Cool RN, BSN, Kim RN, BSN Signed: 07/23/2016 4:52:13 PM By: Worthy Keeler PA-C Previous  Signature: 07/17/2016 1:37:32 AM Version By: Worthy Keeler PA-C Entered By: Gretta Cool RN, BSN, Kim on 07/22/2016 13:29:04 Mike Smith ([id number]) -------------------------------------------------------------------------------- Progress Note Details Patient Name: Mike Smith, Mike Smith. Date of Service: 07/15/2016 9:30 AM Medical Record Number: [id number] Patient Account Number: [id number] Date of Birth/Sex: 11-20-1927 (82 y.o. Male) Treating RN: Mike Smith Primary Care Physician: Mike Smith Other Clinician: Referring Physician: Lynett Smith Treating Physician/Extender: Mike Smith Weeks in Treatment: 0 Subjective Chief Complaint Information obtained from Patient bilateral gluteal pressure ulcers History of Present Illness (HPI) The  following HPI elements were documented for the patient's wound: Location: Bilateral gluteal region Quality: Patient describes the pain as a sore feeling when he applies pressure to the gluteal region. Severity: 3 out of 10 Duration: Patient tells me this is been present for longer than a year and that the ulcers tend to open and close over time. Timing: Pain occurs mainly with manipulation of the wound and specifically with sitting in certain positions that apply pressure to the wound did region. Context: Gradually as result of patient's sitting in his recliner extended periods of time. Modifying Factors: Up to this point patient has been using benzoin spray and the wound has mainly been left open to air at this point. Associated Signs and Symptoms: Patient has chronic renal failure stage III, cataracts, a basal cell carcinoma which has been removed from his right ear. 07/15/16 Patient presents today for initial evaluation of the wound center concerning bilateral gluteal pressure ulcers which she tells me are giving him trouble at this point in time. This was noted by his primary care provider who then referred him to [place]. Overall he has not  really seen anyone from a wound care perspective up to this point. fortunately he is able to get up and move on his own however during shifting and moving he does experience some sheer friction to the gluteal region which I believe may be contributing to this as well. Wound History Patient presents with 2 open wounds that have been present for approximately 2 years. Patient has been treating wounds in the following manner: benzene spray. The wounds have been healed in the past but have re-opened. Laboratory tests have been performed in the last month. Patient reportedly has not tested positive for an antibiotic resistant organism. Patient reportedly has not tested positive for osteomyelitis. Patient History Allergies Sulfa (Sulfonamide Antibiotics) Mike Smith, Mike Smith ([id number]) Family History Heart Disease - Father, Paternal Grandparents, Hypertension - Paternal Grandparents, Father, Thyroid Problems - Child, No family history of Cancer, Diabetes, Kidney Disease, Lung Disease, Seizures, Stroke. Social History Never smoker, Marital Status - Widowed, Alcohol Use - Rarely, Drug Use - No History, Caffeine Use - Daily. Medical History Eyes Patient has history of Cataracts - removed Denies history of Glaucoma, Optic Neuritis Ear/Nose/Mouth/Throat Denies history of Chronic sinus problems/congestion, Middle ear problems Hematologic/Lymphatic Denies history of Anemia, Hemophilia, Human Immunodeficiency Virus, Lymphedema, Sickle Cell Disease Respiratory Denies history of Aspiration, Asthma, Chronic Obstructive Pulmonary Disease (COPD), Pneumothorax, Sleep Apnea, Tuberculosis Cardiovascular Denies history of Angina, Arrhythmia, Congestive Heart Failure, Coronary Artery Disease, Deep Vein Thrombosis, Hypertension, Hypotension, Myocardial Infarction, Peripheral Arterial Disease, Peripheral Venous Disease, Phlebitis, Vasculitis Gastrointestinal Denies history of Cirrhosis , Colitis, Crohn s,  Hepatitis A, Hepatitis B, Hepatitis C Endocrine Denies history of Type I Diabetes, Type II Diabetes Immunological Denies history of Lupus Erythematosus, Raynaud s, Scleroderma Integumentary (Skin) Denies history of History of Burn, History of pressure wounds Musculoskeletal Patient has history of Osteoarthritis Denies history of Gout, Rheumatoid Arthritis, Osteomyelitis Neurologic Denies history of Dementia, Neuropathy, Quadriplegia, Paraplegia, Seizure Disorder Oncologic Denies history of Received Chemotherapy, Received Radiation Psychiatric Denies history of Anorexia/bulimia, Confinement Anxiety Medical And Surgical History Notes Constitutional Symptoms (General Health) Essential Hypertension; HTL; CRF (State 3) Basal cell right ear 6 mos ago Genitourinary CRF Stage 3 Review of Systems (ROS) Constitutional Symptoms (General Health) The patient has no complaints or symptoms. Eyes Mike Smith, Mike Smith. ([id number]) Complains or has symptoms of Glasses / Contacts. Denies complaints or symptoms of Dry Eyes, Vision Changes. Ear/Nose/Mouth/Throat The  patient has no complaints or symptoms. Hematologic/Lymphatic The patient has no complaints or symptoms. Respiratory Complains or has symptoms of Chronic or frequent coughs, Shortness of Breath. Cardiovascular The patient has no complaints or symptoms. Gastrointestinal Complains or has symptoms of Frequent diarrhea. Denies complaints or symptoms of Nausea, Vomiting. Endocrine The patient has no complaints or symptoms. Genitourinary The patient has no complaints or symptoms. Immunological The patient has no complaints or symptoms. Integumentary (Skin) Complains or has symptoms of Wounds. Denies complaints or symptoms of Bleeding or bruising tendency, Breakdown, Swelling. Musculoskeletal The patient has no complaints or symptoms. Neurologic The patient has no complaints or symptoms. Oncologic The patient has no complaints or  symptoms. Psychiatric Denies complaints or symptoms of Anxiety, Claustrophobia. Objective Constitutional supine blood pressure is within target range for patient.. pulse regular and within target range for patient.Marland Kitchen respirations regular, non-labored and within target range for patient.. Well-nourished and well-hydrated in no acute distress. Vitals Time Taken: 9:41 AM, Height: 72 in, Weight: 224 lbs, BMI: 30.4, Temperature: 98.2 F, Pulse: 79 bpm, Respiratory Rate: 16 breaths/min, Blood Pressure: 125/69 mmHg. Eyes conjunctiva clear no eyelid edema noted. pupils equal round and reactive to light and accommodation. Mike Smith, [place] M. ([id number]) Ears, Nose, Mouth, and Throat no gross abnormality of ear auricles or external auditory canals. patient has hearing loss. mucus membranes moist. Respiratory normal breathing without difficulty. clear to auscultation bilaterally. Cardiovascular regular rate and rhythm with normal S1, S2. no clubbing, cyanosis, edema, Gastrointestinal (GI) soft, non-tender, non-distended, +BS. no ventral hernia noted. Musculoskeletal normal gait and posture. no significant deformity or arthritic changes, no loss or range of motion, no clubbing. Psychiatric this patient is able to make decisions and demonstrates good insight into disease process. Alert and Oriented x 3. pleasant and cooperative. Integumentary (Hair, Skin) Wound #1 status is Open. Original cause of wound was Gradually Appeared. The wound is located on the Right Gluteus. The wound measures 0.6cm length x 0.5cm width x 0.1cm depth; 0.236cm^2 area and 0.024cm^3 volume. The wound is limited to skin breakdown. There is a medium amount of serosanguineous drainage noted. The wound margin is flat and intact. There is small (1-33%) pink granulation within the wound bed. There is a large (67-100%) amount of necrotic tissue within the wound bed. The periwound skin appearance exhibited: Scarring. The  periwound skin appearance did not exhibit: Callus, Crepitus, Excoriation, Fluctuance, Friable, Induration, Localized Edema, Rash, Dry/Scaly, Maceration, Moist, Atrophie Blanche, Cyanosis, Ecchymosis, Hemosiderin Staining, Mottled, Pallor, Rubor, Erythema. Wound #2 status is Open. Original cause of wound was Pressure Injury. The wound is located on the Left Gluteus. The wound measures 2cm length x 1cm width x 0.1cm depth; 1.571cm^2 area and 0.157cm^3 volume. The wound is limited to skin breakdown. There is no tunneling or undermining noted. There is a small amount of serous drainage noted. The wound margin is flat and intact. There is small (1-33%) pink granulation within the wound bed. There is a large (67-100%) amount of necrotic tissue within the wound bed including Eschar. The periwound skin appearance did not exhibit: Callus, Crepitus, Excoriation, Fluctuance, Friable, Induration, Localized Edema, Rash, Scarring, Dry/Scaly, Maceration, Moist, Atrophie Blanche, Cyanosis, Ecchymosis, Hemosiderin Staining, Mottled, Pallor, Rubor, Erythema. Assessment Active Problems ICD-10 Mike Smith, Mike Smith ([id number]) [phone] - Pressure ulcer of right buttock, stage 2 L89.322 - Pressure ulcer of left buttock, stage 2 N18.3 - Chronic kidney disease, stage 3 (moderate) Plan Wound Cleansing: Wound #1 Right Gluteus: Clean wound with Normal Saline. Wound #2 Left Gluteus: Clean wound with Normal  Saline. Primary Wound Dressing: Wound #1 Right Gluteus: Other: - Hydrogel Wound #2 Left Gluteus: Other: - Hydrogel Secondary Dressing: Wound #1 Right Gluteus: Boardered Foam Dressing Wound #2 Left Gluteus: Boardered Foam Dressing Dressing Change Frequency: Wound #1 Right Gluteus: Change Dressing Monday, Wednesday, Friday Wound #2 Left Gluteus: Change Dressing Monday, Wednesday, Friday Follow-up Appointments: Wound #1 Right Gluteus: Return Appointment in 1 week. Wound #2 Left Gluteus: Return  Appointment in 1 week. Off-Loading: Wound #1 Right Gluteus: Turn and reposition every 2 hours - Move to different locations Wound #2 Left Gluteus: Turn and reposition every 2 hours - Move to different locations Additional Orders / Instructions: Wound #1 Right Gluteus: Increase protein intake. Wound #2 Left Gluteus: Increase protein intake. Mike Smith, Mike Smith [place]. ([id number]) Follow-Up Appointments: A follow-up appointment should be scheduled. Medication Reconciliation completed and provided to Patient/Care Provider. A Patient Clinical Summary of Care was provided to DF I had a lengthy conversation with Kayvion Arneson today concerning the bilateral gluteal pressure ulcers. I explained that the most important thing for him right now is going to be repositioning. I do not want him sliding on his gluteal region if at all possible and I want him sitting for no longer than a maximum of 2 hours in any one position although less would even be better. He has a recliner as well as a loveseat where he can sit in the recliner and then subsequently go to the loveseat to lean towards the right and then the left in order to change positions throughout the day. I think this is appropriate. In the meantime I'm going I actually have them apply hydrogel along with a border foam dressing over the wounds in order to moisten the wound bed so that hopefully this will heal more readily. We will see him back for a follow-up visit in one week to reevaluate and see how things are doing at that point in time. Otherwise all questions and concerns from both patient as well as his son who is with him during the office visit today were encouraged and answered to the best of my ability. Electronic Signature(s) Signed: 07/22/2016 1:46:02 PM By: Gretta Cool RN, BSN, Kim RN, BSN Signed: 07/23/2016 4:52:13 PM By: Worthy Keeler PA-C Previous Signature: 07/17/2016 1:37:32 AM Version By: Worthy Keeler PA-C Entered By: Gretta Cool RN,  BSN, Kim on 07/22/2016 13:29:45 Mike Smith ([id number]) -------------------------------------------------------------------------------- ROS/PFSH Details Patient Name: ELIMELECH, HOUSEMAN. Date of Service: 07/15/2016 9:30 AM Medical Record Number: [id number] Patient Account Number: [id number] Date of Birth/Sex: Sep 21, 1928 (82 y.o. Male) Treating RN: Mike Smith Primary Care Physician: Mike Smith Other Clinician: Referring Physician: Lynett Smith Treating Physician/Extender: Mike Smith Weeks in Treatment: 0 Wound History Do you currently have one or more open woundso Yes How many open wounds do you currently haveo 2 Approximately how long have you had your woundso 2 years How have you been treating your wound(s) until nowo benzene spray Has your wound(s) ever healed and then re-openedo Yes Have you had any lab work done in the past montho Yes Have you tested positive for an antibiotic resistant organism (MRSA, VRE)o No Have you tested positive for osteomyelitis (bone infection)o No Eyes Complaints and Symptoms: Positive for: Glasses / Contacts Negative for: Dry Eyes; Vision Changes Medical History: Positive for: Cataracts - removed Negative for: Glaucoma; Optic Neuritis Respiratory Complaints and Symptoms: Positive for: Chronic or frequent coughs; Shortness of Breath Medical History: Negative for: Aspiration; Asthma; Chronic Obstructive Pulmonary Disease (COPD); Pneumothorax; Sleep  Apnea; Tuberculosis Gastrointestinal Complaints and Symptoms: Positive for: Frequent diarrhea Negative for: Nausea; Vomiting Medical History: Negative for: Cirrhosis ; Colitis; Crohnos; Hepatitis A; Hepatitis B; Hepatitis C Integumentary (Skin) Complaints and Symptoms: Positive for: Wounds Negative for: Bleeding or bruising tendency; Breakdown; Swelling DEMIR, TITSWORTH ([id number]) Medical History: Negative for: History of Burn; History of pressure  wounds Psychiatric Complaints and Symptoms: Negative for: Anxiety; Claustrophobia Medical History: Negative for: Anorexia/bulimia; Confinement Anxiety Constitutional Symptoms (General Health) Complaints and Symptoms: No Complaints or Symptoms Medical History: Past Medical History Notes: Essential Hypertension; HTL; CRF (State 3) Basal cell right ear 6 mos ago Ear/Nose/Mouth/Throat Complaints and Symptoms: No Complaints or Symptoms Medical History: Negative for: Chronic sinus problems/congestion; Middle ear problems Hematologic/Lymphatic Complaints and Symptoms: No Complaints or Symptoms Medical History: Negative for: Anemia; Hemophilia; Human Immunodeficiency Virus; Lymphedema; Sickle Cell Disease Cardiovascular Complaints and Symptoms: No Complaints or Symptoms Medical History: Negative for: Angina; Arrhythmia; Congestive Heart Failure; Coronary Artery Disease; Deep Vein Thrombosis; Hypertension; Hypotension; Myocardial Infarction; Peripheral Arterial Disease; Peripheral Venous Disease; Phlebitis; Vasculitis Endocrine Complaints and Symptoms: No Complaints or Symptoms BRAYAM, BOEKE ([id number]) Medical History: Negative for: Type I Diabetes; Type II Diabetes Genitourinary Complaints and Symptoms: No Complaints or Symptoms Medical History: Past Medical History Notes: CRF Stage 3 Immunological Complaints and Symptoms: No Complaints or Symptoms Medical History: Negative for: Lupus Erythematosus; Raynaudos; Scleroderma Musculoskeletal Complaints and Symptoms: No Complaints or Symptoms Medical History: Positive for: Osteoarthritis Negative for: Gout; Rheumatoid Arthritis; Osteomyelitis Neurologic Complaints and Symptoms: No Complaints or Symptoms Medical History: Negative for: Dementia; Neuropathy; Quadriplegia; Paraplegia; Seizure Disorder Oncologic Complaints and Symptoms: No Complaints or Symptoms Medical History: Negative for: Received Chemotherapy;  Received Radiation HBO Extended History Items Eyes: Cataracts Immunizations Pneumococcal Vaccine: Mike Smith, Mike Smith ([id number]) Received Pneumococcal Vaccination: Yes Family and Social History Cancer: No; Diabetes: No; Heart Disease: Yes - Father, Paternal Grandparents; Hypertension: Yes - Paternal Grandparents, Father; Kidney Disease: No; Lung Disease: No; Seizures: No; Stroke: No; Thyroid Problems: Yes - Child; Never smoker; Marital Status - Widowed; Alcohol Use: Rarely; Drug Use: No History; Caffeine Use: Daily; Advanced Directives: No; Patient does not want information on Advanced Directives; Do not resuscitate: No; Living Will: No; Medical Power of Attorney: No Electronic Signature(s) Signed: 07/22/2016 1:46:02 PM By: Gretta Cool, RN, BSN, Kim RN, BSN Signed: 07/23/2016 4:52:13 PM By: Worthy Keeler PA-C Previous Signature: 07/17/2016 4:28:52 PM Version By: Gretta Cool RN, BSN, Kim RN, BSN Entered By: Gretta Cool, RN, BSN, Kim on 07/22/2016 13:26:48 Mike Smith ([id number]) -------------------------------------------------------------------------------- [place] Details Patient Name: MITUL, HALLOWELL. Date of Service: 07/15/2016 Medical Record Number: [id number] Patient Account Number: [id number] Date of Birth/Sex: 1928/09/16 (82 y.o. Male) Treating RN: Mike Smith Primary Care Physician: Mike Smith Other Clinician: Referring Physician: Lynett Smith Treating Physician/Extender: Mike Smith Weeks in Treatment: 0 Diagnosis Coding ICD-10 Codes Code Description [phone] Pressure ulcer of right buttock, stage 2 L89.322 Pressure ulcer of left buttock, stage 2 N18.3 Chronic kidney disease, stage 3 (moderate) Facility Procedures CPT4 Code: 41660630 Description: 99214 - WOUND CARE VISIT-LEV 4 EST PT Modifier: Quantity: 1 Physician Procedures CPT4 Code: [id number] Description: 23557 - WC PHYS LEVEL 4 - EST PT ICD-10 Description Diagnosis L89.312 Pressure ulcer of right  buttock, stage 2 L89.322 Pressure ulcer of left buttock, stage 2 N18.3 Chronic kidney disease, stage 3 (moderate) Modifier: Quantity: 1 Electronic Signature(s) Signed: 07/17/2016 1:37:32 AM By: Worthy Keeler PA-C Entered By: Worthy Keeler on 07/15/2016 11:19:06

## 2016-07-30 ENCOUNTER — Ambulatory Visit: Payer: Medicare Other | Admitting: Internal Medicine

## 2016-08-06 ENCOUNTER — Encounter: Payer: Medicare Other | Admitting: Internal Medicine

## 2016-08-06 DIAGNOSIS — L89312 Pressure ulcer of right buttock, stage 2: Secondary | ICD-10-CM | POA: Diagnosis not present

## 2016-08-07 NOTE — Progress Notes (Signed)
Mike Smith, [place] ([id number]) Visit Report for 08/06/2016 Chief Complaint Document Details Mike Smith Date of Service: 08/06/2016 10:00 AM Patient Name: [name] Account Number: [id number] Medical Record Treating RN: Baruch Gouty RN, BSN, Velva Harman [id number] Number: Other Clinician: Date of Birth/Sex: [birth year] (82 y.o. Male) Treating Dellia Nims, [place] Primary Care Physician/Extender: Delorise Royals, John Physician: Referring Physician: Olga Millers in Treatment: 3 Information Obtained from: Patient Chief Complaint bilateral gluteal pressure ulcers Electronic Signature(s) Signed: 08/06/2016 6:13:30 PM By: Linton Ham MD Entered By: Linton Ham on 08/06/2016 10:48:37 Randa Lynn ([id number]) -------------------------------------------------------------------------------- HPI Details Mike Smith Date of Service: 08/06/2016 10:00 AM Patient Name: Mike Smith. Patient Account Number: [id number] Medical Record Treating RN: Baruch Gouty RN, BSN, Velva Harman [id number] Number: Other Clinician: Date of Birth/Sex: 1928-07-17 (82 y.o. Male) Treating Dellia Nims, MICHAEL Primary Care Physician/Extender: Delorise Royals, John Physician: Referring Physician: Olga Millers in Treatment: 3 History of Present Illness Location: Bilateral gluteal region Quality: Patient describes the pain as a sore feeling when he applies pressure to the gluteal region. Severity: 3 out of 10 Duration: Patient tells me this is been present for longer than a year and that the ulcers tend to open and close over time. Timing: Pain occurs mainly with manipulation of the wound and specifically with sitting in certain positions that apply pressure to the wound did region. Context: Gradually as result of patient's sitting in his recliner extended periods of time. Modifying Factors: Up to this point patient has been using benzoin spray and the wound has mainly been left open to air at this point. Associated  Signs and Symptoms: Patient has chronic renal failure stage III, cataracts, a basal cell carcinoma which has been removed from his right ear. HPI Description: 07/15/16 Patient presents today for initial evaluation of the wound center concerning bilateral gluteal pressure ulcers which she tells me are giving him trouble at this point in time. This was noted by his primary care provider who then referred him to [place]. Overall he has not really seen anyone from a wound care perspective up to this point. fortunately he is able to get up and move on his own however during shifting and moving he does experience some sheer friction to the gluteal region which I believe may be contributing to this as well. 08/06/16; this patient was admitted to the clinic a few weeks ago but hasn't been seen since I think mostly for logistic reasons/plans elations in any case this is an elderly man who has bilateral gluteal pressure areas has been using hydrogel to this at home. In further questioning it turns out that this man sits a lot during the day and sleeps in a lift chair. I think this sleeping at night and a lift chair is probably what's behind the continued pressure in this area Electronic Signature(s) Signed: 08/06/2016 6:13:30 PM By: Linton Ham MD Entered By: Linton Ham on 08/06/2016 10:49:41 Randa Lynn ([id number]) -------------------------------------------------------------------------------- Physical Exam Details Mike Smith Date of Service: 08/06/2016 10:00 AM Patient Name: Mike Smith. Patient Account Number: [id number] Medical Record Treating RN: Baruch Gouty RN, BSN, Velva Harman [id number] Number: Other Clinician: Date of Birth/Sex: [birth year] (82 y.o. Male) Treating Dellia Nims, [place] Primary Care Physician/Extender: Delorise Royals, John Physician: Referring Physician: Olga Millers in Treatment: 3 Constitutional Sitting or standing Blood Pressure is within target range for patient..  Pulse regular and within target range for patient.Marland Kitchen Respirations regular, non-labored and within target range.. Temperature is normal and within the target range for the patient.. Patient's  appearance is neat and clean. Appears in no acute distress. Well nourished and well developed.. Notes Wound exam; the patient has stage I pressure areas over his upper gluteal areas mirror-image right and left. He also has a linear stage II wound about a three-quarter inch length wound in the coccyx area itself. There is no evidence of infection the areas are not tender. Electronic Signature(s) Signed: 08/06/2016 6:13:30 PM By: Linton Ham MD Entered By: Linton Ham on 08/06/2016 10:51:51 Randa Lynn ([id number]) -------------------------------------------------------------------------------- Physician Orders Details Mike Smith Date of Service: 08/06/2016 10:00 AM Patient Name: Mike Smith. Patient Account Number: [id number] Medical Record Treating RN: Baruch Gouty RN, BSN, Velva Harman [id number] Number: Other Clinician: Date of Birth/Sex: April 21, 1928 (82 y.o. Male) Treating Dellia Nims, MICHAEL Primary Care Physician/Extender: Delorise Royals, John Physician: Referring Physician: Olga Millers in Treatment: 3 Verbal / Phone Orders: Yes Clinician: Afful, RN, BSN, Rita Read Back and Verified: Yes Diagnosis Coding Wound Cleansing Wound #1 Right Gluteus o Clean wound with Normal Saline. Primary Wound Dressing Wound #1 Right Gluteus o Promogran Secondary Dressing Wound #1 Right Gluteus o Boardered Foam Dressing Dressing Change Frequency Wound #1 Right Gluteus o Change Dressing Monday, Wednesday, Friday Follow-up Appointments Wound #1 Right Gluteus o Return Appointment in 2 weeks. Off-Loading Wound #1 Right Gluteus o Turn and reposition every 2 hours - Move to different locations Additional Orders / Instructions Wound #1 Right Gluteus o Increase protein intake. Electronic  Signature(s) Signed: 08/06/2016 5:35:18 PM By: Regan Lemming BSN, RN Signed: 08/06/2016 6:13:30 PM By: Linton Ham MD Randa Lynn ([id number]) Entered By: Regan Lemming on 08/06/2016 10:50:46 JUDY, GOODENOW ([id number]) -------------------------------------------------------------------------------- Problem List Details Mike Smith Date of Service: 08/06/2016 10:00 AM Patient Name: Mike Smith. Patient Account Number: [id number] Medical Record Treating RN: Baruch Gouty RN, BSN, Velva Harman [id number] Number: Other Clinician: Date of Birth/Sex: 11-11-1927 (82 y.o. Male) Treating Dellia Nims, [place] Primary Care Physician/Extender: Delorise Royals, John Physician: Referring Physician: Olga Millers in Treatment: 3 Active Problems ICD-10 Encounter Code Description Active Date Diagnosis L89.312 Pressure ulcer of right buttock, stage 2 07/15/2016 Yes L89.322 Pressure ulcer of left buttock, stage 2 07/15/2016 Yes N18.3 Chronic kidney disease, stage 3 (moderate) 07/15/2016 Yes Inactive Problems Resolved Problems Electronic Signature(s) Signed: 08/06/2016 6:13:30 PM By: Linton Ham MD Entered By: Linton Ham on 08/06/2016 10:48:19 Randa Lynn ([id number]) -------------------------------------------------------------------------------- Progress Note Details Mike Smith Date of Service: 08/06/2016 10:00 AM Patient Name: Mike Smith. Patient Account Number: [id number] Medical Record Treating RN: Baruch Gouty RN, BSN, Velva Harman [id number] Number: Other Clinician: Date of Birth/Sex: Oct 17, 1927 (82 y.o. Male) Treating Dellia Nims, MICHAEL Primary Care Physician/Extender: Delorise Royals, John Physician: Referring Physician: Olga Millers in Treatment: 3 Subjective Chief Complaint Information obtained from Patient bilateral gluteal pressure ulcers History of Present Illness (HPI) The following HPI elements were documented for the patient's wound: Location: Bilateral gluteal  region Quality: Patient describes the pain as a sore feeling when he applies pressure to the gluteal region. Severity: 3 out of 10 Duration: Patient tells me this is been present for longer than a year and that the ulcers tend to open and close over time. Timing: Pain occurs mainly with manipulation of the wound and specifically with sitting in certain positions that apply pressure to the wound did region. Context: Gradually as result of patient's sitting in his recliner extended periods of time. Modifying Factors: Up to this point patient has been using benzoin spray and the wound has mainly been left open to air at this point. Associated  Signs and Symptoms: Patient has chronic renal failure stage III, cataracts, a basal cell carcinoma which has been removed from his right ear. 07/15/16 Patient presents today for initial evaluation of the wound center concerning bilateral gluteal pressure ulcers which she tells me are giving him trouble at this point in time. This was noted by his primary care provider who then referred him to [place]. Overall he has not really seen anyone from a wound care perspective up to this point. fortunately he is able to get up and move on his own however during shifting and moving he does experience some sheer friction to the gluteal region which I believe may be contributing to this as well. 08/06/16; this patient was admitted to the clinic a few weeks ago but hasn't been seen since I think mostly for logistic reasons/plans elations in any case this is an elderly man who has bilateral gluteal pressure areas has been using hydrogel to this at home. In further questioning it turns out that this man sits a lot during the day and sleeps in a lift chair. I think this sleeping at night and a lift chair is probably what's behind the continued pressure in this area [place]. ([id number]) Objective Constitutional Sitting or standing Blood Pressure is within target  range for patient.. Pulse regular and within target range for patient.Marland Kitchen Respirations regular, non-labored and within target range.. Temperature is normal and within the target range for the patient.. Patient's appearance is neat and clean. Appears in no acute distress. Well nourished and well developed.. Vitals Time Taken: 10:18 AM, Height: 72 in, Weight: 224 lbs, BMI: 30.4, Temperature: 97.7 F, Pulse: 64 bpm, Respiratory Rate: 17 breaths/min, Blood Pressure: 137/63 mmHg. General Notes: Wound exam; the patient has stage I pressure areas over his upper gluteal areas mirror- image right and left. He also has a linear stage II wound about a three-quarter inch length wound in the coccyx area itself. There is no evidence of infection the areas are not tender. Integumentary (Hair, Skin) Wound #1 status is Open. Original cause of wound was Gradually Appeared. The wound is located on the Right Gluteus. The wound measures 0.2cm length x 0.2cm width x 0.1cm depth; 0.031cm^2 area and 0.003cm^3 volume. The wound is limited to skin breakdown. There is no tunneling or undermining noted. There is a small amount of serosanguineous drainage noted. The wound margin is flat and intact. There is large (67-100%) pink granulation within the wound bed. There is no necrotic tissue within the wound bed. The periwound skin appearance exhibited: Scarring, Moist, Mottled, Erythema. The periwound skin appearance did not exhibit: Callus, Crepitus, Excoriation, Fluctuance, Friable, Induration, Localized Edema, Rash, Dry/Scaly, Maceration, Atrophie Blanche, Cyanosis, Ecchymosis, Hemosiderin Staining, Pallor, Rubor. The surrounding wound skin color is noted with erythema which is circumferential. General Notes: Periwound appears DTI with loss bubble intact light purple looking. Wound #2 status is Healed - Epithelialized. Original cause of wound was Pressure Injury. The wound is located on the Left Gluteus. The wound measures  0cm length x 0cm width x 0cm depth; 0cm^2 area and 0cm^3 volume. The wound is limited to skin breakdown. There is no tunneling or undermining noted. There is a none present amount of drainage noted. The wound margin is flat and intact. There is no granulation within the wound bed. There is no necrotic tissue within the wound bed. The periwound skin appearance exhibited: Dry/Scaly. The periwound skin appearance did not exhibit: Callus, Crepitus, Excoriation, Fluctuance, Friable, Induration, Localized Edema, Rash, Scarring,  Maceration, Moist, Atrophie Blanche, Cyanosis, Ecchymosis, Hemosiderin Staining, Mottled, Pallor, Rubor, Erythema. Assessment Active Problems ICD-10 L89.312 - Pressure ulcer of right buttock, stage 2 L89.322 - Pressure ulcer of left buttock, stage 2 N18.3 - Chronic kidney disease, stage 3 (moderate) DEWARREN, LEDBETTER. ([id number]) Plan Wound Cleansing: Wound #1 Right Gluteus: Clean wound with Normal Saline. Primary Wound Dressing: Wound #1 Right Gluteus: Promogran Secondary Dressing: Wound #1 Right Gluteus: Boardered Foam Dressing Dressing Change Frequency: Wound #1 Right Gluteus: Change Dressing Monday, Wednesday, Friday Follow-up Appointments: Wound #1 Right Gluteus: Return Appointment in 2 weeks. Off-Loading: Wound #1 Right Gluteus: Turn and reposition every 2 hours - Move to different locations Additional Orders / Instructions: Wound #1 Right Gluteus: Increase protein intake. #1 I think these areas are pressure related from prolonged sitting including overnight and a lift chair. Of spoken to the patient and his son about this I think he would be better served by sleeping in bed and attempting to offload the area. Also when he is sitting an attempt to keep his weight on his upper thighs and over the initial tuberosity is would probably help to offload this area. #2 he has 2 small open areas one on the right tibial a small stage II wound and the linear  wound over his lower coccyx which is also a stage II pressure area. Electronic Signature(s) Signed: 08/06/2016 6:13:30 PM By: Linton Ham MD Entered By: Linton Ham on 08/06/2016 10:52:58 DALONTE, HARDAGE ([id number]) EGOR, F[place] ([id number]) -------------------------------------------------------------------------------- SuperBill Details Mike Smith Date of Service: 08/06/2016 Patient Name: Mike Smith. Patient Account Number: [id number] Medical Record Treating RN: Baruch Gouty RN, BSN, Velva Harman [id number] Number: Other Clinician: Date of Birth/Sex: 1928/02/16 (82 y.o. Male) Treating Dellia Nims, [place] Primary Care Physician/Extender: Delorise Royals John Physician: Suella [place] in Treatment: 3 Referring Physician: Lynett Fish Diagnosis Coding ICD-10 Codes Code Description [phone] Pressure ulcer of right buttock, stage 2 L89.322 Pressure ulcer of left buttock, stage 2 N18.3 Chronic kidney disease, stage 3 (moderate) Facility Procedures CPT4 Code: [id number] Description: [id number] - WOUND CARE VISIT-LEV 2 EST PT Modifier: Quantity: 1 Physician Procedures CPT4 Code: [id number] Description: 93716 - WC PHYS LEVEL 2 - EST PT ICD-10 Description Diagnosis L89.312 Pressure ulcer of right buttock, stage 2 L89.322 Pressure ulcer of left buttock, stage 2 Modifier: Quantity: 1 Electronic Signature(s) Signed: 08/06/2016 6:13:30 PM By: Linton Ham MD Entered By: Linton Ham on 08/06/2016 10:53:47

## 2016-08-07 NOTE — Progress Notes (Signed)
ENOS, MUHL ([id number]) Visit Report for 08/06/2016 Arrival Information Details Mike Smith Date of Service: 08/06/2016 10:00 AM Patient Name: [name] Account Number: [id number] Medical Record Treating RN: Mike Gouty, RN, BSN, Velva Harman [id number] Number: Other Clinician: Date of Birth/Sex: 1928-04-26 (82 y.o. Male) Treating Mike Smith Primary Care Physician: Mike Smith Physician/Extender: G Referring Physician: Olga Smith in Treatment: 3 Visit Information History Since Last Visit All ordered tests and consults were completed: No Patient Arrived: Mike Smith Added or deleted any medications: No Arrival Time: 10:17 Any new allergies or adverse reactions: No Accompanied By: son Had a fall or experienced change in No Transfer Assistance: None activities of daily living that may affect Patient Identification Verified: Yes risk of falls: Secondary Verification Process Yes Signs or symptoms of abuse/neglect since last No Completed: visito Patient Has Alerts: Yes Hospitalized since last visit: No Patient Alerts: NOT Has Dressing in Place as Prescribed: Yes Diabetic Pain Present Now: No Electronic Signature(s) Signed: 08/06/2016 5:35:18 PM By: Regan Lemming BSN, RN Entered By: Regan Lemming on 08/06/2016 10:17:53 Mike Smith ([id number]) -------------------------------------------------------------------------------- Clinic Level of Care Assessment Details Mike Smith Date of Service: 08/06/2016 10:00 AM Patient Name: [name] Account Number: [id number] Medical Record Treating RN: Mike Gouty, RN, BSN, Velva Harman [id number] Number: Other Clinician: Date of Birth/Sex: [birth year] (82 y.o. Male) Treating Mike Smith Primary Care Physician: Mike Smith Physician/Extender: G Referring Physician: Olga Smith in Treatment: 3 Clinic Level of Care Assessment Items TOOL 4 Quantity Score '[]'$  - Use when only an EandM is performed on FOLLOW-UP  visit 0 ASSESSMENTS - Nursing Assessment / Reassessment X - Reassessment of Co-morbidities (includes updates in patient status) 1 10 X - Reassessment of Adherence to Treatment Plan 1 5 ASSESSMENTS - Wound and Skin Assessment / Reassessment X - Simple Wound Assessment / Reassessment - one wound 1 5 '[]'$  - Complex Wound Assessment / Reassessment - multiple wounds 0 '[]'$  - Dermatologic / Skin Assessment (not related to wound area) 0 ASSESSMENTS - Focused Assessment '[]'$  - Circumferential Edema Measurements - multi extremities 0 '[]'$  - Nutritional Assessment / Counseling / Intervention 0 '[]'$  - Lower Extremity Assessment (monofilament, tuning fork, pulses) 0 '[]'$  - Peripheral Arterial Disease Assessment (using hand held doppler) 0 ASSESSMENTS - Ostomy and/or Continence Assessment and Care '[]'$  - Incontinence Assessment and Management 0 '[]'$  - Ostomy Care Assessment and Management (repouching, etc.) 0 PROCESS - Coordination of Care X - Simple Patient / Family Education for ongoing care 1 15 '[]'$  - Complex (extensive) Patient / Family Education for ongoing care 0 '[]'$  - Staff obtains Programmer, systems, Records, Test Results / Process Orders 0 '[]'$  - Staff telephones HHA, Nursing Homes / Clarify orders / etc 0 Mike Smith, MCCLIMANS. ([id number]) '[]'$  - Routine Transfer to another Facility (non-emergent condition) 0 '[]'$  - Routine Hospital Admission (non-emergent condition) 0 '[]'$  - New Admissions / Biomedical engineer / Ordering NPWT, Apligraf, etc. 0 '[]'$  - Emergency Hospital Admission (emergent condition) 0 '[]'$  - Simple Discharge Coordination 0 '[]'$  - Complex (extensive) Discharge Coordination 0 PROCESS - Special Needs '[]'$  - Pediatric / Minor Patient Management 0 '[]'$  - Isolation Patient Management 0 '[]'$  - Hearing / Language / Visual special needs 0 '[]'$  - Assessment of Community assistance (transportation, D/C planning, etc.) 0 '[]'$  - Additional assistance / Altered mentation 0 '[]'$  - Support Surface(s) Assessment (bed, cushion, seat,  etc.) 0 INTERVENTIONS - Wound Cleansing / Measurement X - Simple Wound Cleansing - one wound 1 5 '[]'$  - Complex Wound Cleansing -  multiple wounds 0 X - Wound Imaging (photographs - any number of wounds) 1 5 '[]'$  - Wound Tracing (instead of photographs) 0 X - Simple Wound Measurement - one wound 1 5 '[]'$  - Complex Wound Measurement - multiple wounds 0 INTERVENTIONS - Wound Dressings X - Small Wound Dressing one or multiple wounds 1 10 '[]'$  - Medium Wound Dressing one or multiple wounds 0 '[]'$  - Large Wound Dressing one or multiple wounds 0 '[]'$  - Application of Medications - topical 0 '[]'$  - Application of Medications - injection 0 Mike Smith, Mike Smith ([id number]) INTERVENTIONS - Miscellaneous '[]'$  - External ear exam 0 '[]'$  - Specimen Collection (cultures, biopsies, blood, body fluids, etc.) 0 '[]'$  - Specimen(s) / Culture(s) sent or taken to Lab for analysis 0 '[]'$  - Patient Transfer (multiple staff / Civil Service fast streamer / Similar devices) 0 '[]'$  - Simple Staple / Suture removal (25 or less) 0 '[]'$  - Complex Staple / Suture removal (26 or more) 0 '[]'$  - Hypo / Hyperglycemic Management (close monitor of Blood Glucose) 0 '[]'$  - Ankle / Brachial Index (ABI) - do not check if billed separately 0 X - Vital Signs 1 5 Has the patient been seen at the hospital within the last three years: Yes Total Score: 65 Level Of Care: New/Established - Level 2 Electronic Signature(s) Signed: 08/06/2016 5:35:18 PM By: Regan Lemming BSN, RN Entered By: Regan Lemming on 08/06/2016 10:43:51 Mike Smith ([id number]) -------------------------------------------------------------------------------- Encounter Discharge Information Details Mike Smith Date of Service: 08/06/2016 10:00 AM Patient Name: Mike Mages. Patient Account Number: [id number] Medical Record Treating RN: Mike Gouty RN, BSN, Velva Harman [id number] Number: Other Clinician: Date of Birth/Sex: 12/26/1927 (82 y.o. Male) Treating Mike Smith Primary Care Physician: Mike Smith Physician/Extender: G Referring Physician: Olga Smith in Treatment: 3 Encounter Discharge Information Items Discharge Pain Level: 0 Discharge Condition: Stable Ambulatory Status: Cane Discharge Destination: Home Transportation: Private Auto Accompanied By: son Schedule Follow-up Appointment: No Medication Reconciliation completed and provided to Patient/Care No Jerrick Farve: Provided on Clinical Summary of Care: 08/06/2016 Form Type Recipient Paper Patient DF Electronic Signature(s) Signed: 08/06/2016 10:54:08 AM By: Ruthine Dose Entered By: Ruthine Dose on 08/06/2016 10:54:07 Mike Smith ([id number]) -------------------------------------------------------------------------------- Lower Extremity Assessment Details Mike Smith Date of Service: 08/06/2016 10:00 AM Patient Name: Mike Mages. Patient Account Number: [id number] Medical Record Treating RN: Mike Gouty RN, BSN, Velva Harman [id number] Number: Other Clinician: Date of Birth/Sex: 10-05-1928 (82 y.o. Male) [place], [place] Primary Care Physician: Mike Smith Physician/Extender: G Referring Physician: Olga Smith in Treatment: 3 Electronic Signature(s) Signed: 08/06/2016 5:35:18 PM By: Regan Lemming BSN, RN Entered By: Regan Lemming on 08/06/2016 10:18:50 Mike Smith ([id number]) -------------------------------------------------------------------------------- Multi Wound Chart Details Mike Smith Date of Service: 08/06/2016 10:00 AM Patient Name: Mike Mages. Patient Account Number: [id number] Medical Record Treating RN: Mike Gouty RN, BSN, Velva Harman [id number] Number: Other Clinician: Date of Birth/Sex: April 01, 1928 (82 y.o. Male) Treating Mike Smith Primary Care Physician: Mike Smith Physician/Extender: G Referring Physician: Olga Smith in Treatment: 3 Vital Signs Height(in): 72 Pulse(bpm): 64 Weight(lbs): 224 Blood Pressure 137/63 (mmHg): Body Mass Index(BMI):  30 Temperature(F): 97.7 Respiratory Rate 17 (breaths/min): Photos: [1:No Photos] [2:No Photos] [N/A:N/A] Wound Location: [1:Right Gluteus] [2:Left Gluteus] [N/A:N/A] Wounding Event: [1:Gradually Appeared] [2:Pressure Injury] [N/A:N/A] Primary Etiology: [1:Pressure Ulcer] [2:Pressure Ulcer] [N/A:N/A] Comorbid History: [1:Cataracts, Osteoarthritis] [2:Cataracts, Osteoarthritis] [N/A:N/A] Date Acquired: [1:02/25/2016] [2:02/25/2016] [N/A:N/A] Weeks of Treatment: [1:3] [2:3] [N/A:N/A] Wound Status: [1:Open] [2:Healed - Epithelialized] [N/A:N/A] Measurements L x W x D 0.2x0.2x0.1 [2:0x0x0] [N/A:N/A] (cm) Area (cm) : [  1:0.031] [2:0] [N/A:N/A] Volume (cm) : [1:0.003] [2:0] [N/A:N/A] % Reduction in Area: [1:86.90%] [2:100.00%] [N/A:N/A] % Reduction in Volume: 87.50% [2:100.00%] [N/A:N/A] Classification: [1:Category/Stage II] [2:Category/Stage II] [N/A:N/A] Exudate Amount: [1:Small] [2:None Present] [N/A:N/A] Exudate Type: [1:Serosanguineous] [2:N/A] [N/A:N/A] Exudate Color: [1:red, brown] [2:N/A] [N/A:N/A] Wound Margin: [1:Flat and Intact] [2:Flat and Intact] [N/A:N/A] Granulation Amount: [1:Large (67-100%)] [2:None Present (0%)] [N/A:N/A] Granulation Quality: [1:Pink] [2:N/A] [N/A:N/A] Necrotic Amount: [1:None Present (0%)] [2:None Present (0%)] [N/A:N/A] Exposed Structures: [1:Fascia: No Fat: No Tendon: No Muscle: No Joint: No Bone: No] [2:Fascia: No Fat: No Tendon: No Muscle: No Joint: No Bone: No] [N/A:N/A] Limited to Skin Limited to Skin Breakdown Breakdown Epithelialization: Large (67-100%) Large (67-100%) N/A Periwound Skin Texture: Scarring: Yes Edema: No N/A Edema: No Excoriation: No Excoriation: No Induration: No Induration: No Callus: No Callus: No Crepitus: No Crepitus: No Fluctuance: No Fluctuance: No Friable: No Friable: No Rash: No Rash: No Scarring: No Periwound Skin Moist: Yes Dry/Scaly: Yes N/A Moisture: Maceration: No Maceration: No Dry/Scaly:  No Moist: No Periwound Skin Color: Erythema: Yes Atrophie Blanche: No N/A Mottled: Yes Cyanosis: No Atrophie Blanche: No Ecchymosis: No Cyanosis: No Erythema: No Ecchymosis: No Hemosiderin Staining: No Hemosiderin Staining: No Mottled: No Pallor: No Pallor: No Rubor: No Rubor: No Erythema Location: Circumferential N/A N/A Tenderness on No No N/A Palpation: Wound Preparation: Ulcer Cleansing: Ulcer Cleansing: N/A Rinsed/Irrigated with Rinsed/Irrigated with Saline Saline Topical Anesthetic Topical Anesthetic Applied: None Applied: None Assessment Notes: Periwound appears DTI N/A N/A with loss bubble intact light purple looking. Treatment Notes Electronic Signature(s) Signed: 08/06/2016 5:35:18 PM By: Regan Lemming BSN, RN Entered By: Regan Lemming on 08/06/2016 10:37:10 Mike Smith ([id number]) -------------------------------------------------------------------------------- Multi-Disciplinary Care Plan Details Mike Smith Date of Service: 08/06/2016 10:00 AM Patient Name: [name] Account Number: [id number] Medical Record Treating RN: Mike Gouty, RN, BSN, Velva Harman [id number] Number: Other Clinician: Date of Birth/Sex: 04-04-1928 (82 y.o. Male) Treating Mike Smith Primary Care Physician: Mike Smith Physician/Extender: G Referring Physician: Olga Smith in Treatment: 3 Active Inactive Abuse / Safety / Falls / Self Care Management Nursing Diagnoses: Impaired physical mobility Potential for falls Goals: Patient will remain injury free Date Initiated: 07/15/2016 Goal Status: Active Interventions: Assess fall risk on admission and as needed Notes: Orientation to the Wound Care Program Nursing Diagnoses: Knowledge deficit related to the wound healing center program Goals: Patient/caregiver will verbalize understanding of the [facility] Program Date Initiated: 07/15/2016 Goal Status: Active Interventions: Provide education on  orientation to the wound center Notes: Pressure Nursing Diagnoses: Knowledge deficit related to management of pressures ulcers Potential for impaired tissue integrity related to pressure, friction, moisture, and shear Mike Smith, Mike Smith ([id number]) Goals: Patient will remain free of pressure ulcers Date Initiated: 07/15/2016 Goal Status: Active Interventions: Assess: immobility, friction, shearing, incontinence upon admission and as needed Notes: Wound/Skin Impairment Nursing Diagnoses: Impaired tissue integrity Goals: Patient/caregiver will verbalize understanding of skin care regimen Date Initiated: 07/15/2016 Goal Status: Active Interventions: Assess patient/caregiver ability to obtain necessary supplies Notes: Electronic Signature(s) Signed: 08/06/2016 5:35:18 PM By: Regan Lemming BSN, RN Entered By: Regan Lemming on 08/06/2016 10:36:41 Mike Smith ([id number]) -------------------------------------------------------------------------------- Pain Assessment Details Mike Smith Date of Service: 08/06/2016 10:00 AM Patient Name: Mike Mages. Patient Account Number: [id number] Medical Record Treating RN: Mike Gouty RN, BSN, Velva Harman [id number] Number: Other Clinician: Date of Birth/Sex: [birth year] (82 y.o. Male) Treating Mike Smith Primary Care Physician: Mike Smith Physician/Extender: G Referring Physician: Olga Smith in Treatment: 3 Active Problems Location of Pain Severity  and Description of Pain Patient Has Paino No Site Locations With Dressing Change: No Pain Management and Medication Current Pain Management: Electronic Signature(s) Signed: 08/06/2016 5:35:18 PM By: Regan Lemming BSN, RN Entered By: Regan Lemming on 08/06/2016 10:18:01 Mike Smith ([id number]) -------------------------------------------------------------------------------- Patient/Caregiver Education Details Mike Smith Date of Service: 08/06/2016 10:00 AM Patient  Name: Mike Mages. Patient Account Number: [id number] Medical Record Treating RN: Mike Gouty RN, BSN, Velva Harman [id number] Number: Other Clinician: Date of Birth/Gender: 1928/06/30 (82 y.o. Male) [place], [place] Primary Care Physician: Mike Smith Physician/Extender: G Referring Physician: Olga Smith in Treatment: 3 Education Assessment Education Provided To: Patient Education Topics Provided Welcome To [facility]: Methods: Explain/Verbal Responses: State content correctly Wound/Skin Impairment: Methods: Explain/Verbal Responses: State content correctly Electronic Signature(s) Signed: 08/06/2016 5:35:18 PM By: Regan Lemming BSN, RN Entered By: Regan Lemming on 08/06/2016 10:51:44 Mike Smith ([id number]) -------------------------------------------------------------------------------- Wound Assessment Details Mike Smith Date of Service: 08/06/2016 10:00 AM Patient Name: Mike Mages. Patient Account Number: [id number] Medical Record Treating RN: Mike Gouty, RN, BSN, Velva Harman [id number] Number: Other Clinician: Date of Birth/Sex: 06/02/1928 (82 y.o. Male) Treating Dellia Nims, [place] Primary Care Physician: Mike Smith Physician/Extender: G Referring Physician: Olga Smith in Treatment: 3 Wound Status Wound Number: 1 Primary Etiology: Pressure Ulcer Wound Location: Right Gluteus Wound Status: Open Wounding Event: Gradually Appeared Comorbid History: Cataracts, Osteoarthritis Date Acquired: 02/25/2016 Weeks Of Treatment: 3 Clustered Wound: No Photos Photo Uploaded By: Regan Lemming on 08/06/2016 17:07:35 Wound Measurements Length: (cm) 0.2 Width: (cm) 0.2 Depth: (cm) 0.1 Area: (cm) 0.031 Volume: (cm) 0.003 % Reduction in Area: 86.9% % Reduction in Volume: 87.5% Epithelialization: Large (67-100%) Tunneling: No Undermining: No Wound Description Classification: Category/Stage II Wound Margin: Flat and Intact Exudate Amount: Small Exudate  Type: Serosanguineous Exudate Color: red, brown Wound Bed Granulation Amount: Large (67-100%) Exposed Structure Granulation Quality: Pink Fascia Exposed: No Necrotic Amount: None Present (0%) Fat Layer Exposed: No Tendon Exposed: No Muscle Exposed: No Mike Smith, Mike Smith ([id number]) Joint Exposed: No Bone Exposed: No Limited to Skin Breakdown Periwound Skin Texture Texture Color No Abnormalities Noted: No No Abnormalities Noted: No Callus: No Atrophie Blanche: No Crepitus: No Cyanosis: No Excoriation: No Ecchymosis: No Fluctuance: No Erythema: Yes Friable: No Erythema Location: Circumferential Induration: No Hemosiderin Staining: No Localized Edema: No Mottled: Yes Rash: No Pallor: No Scarring: Yes Rubor: No Moisture No Abnormalities Noted: No Dry / Scaly: No Maceration: No Moist: Yes Wound Preparation Ulcer Cleansing: Rinsed/Irrigated with Saline Topical Anesthetic Applied: None Assessment Notes Periwound appears DTI with loss bubble intact light purple looking. Treatment Notes Wound #1 (Right Gluteus) 1. Cleansed with: Clean wound with Normal Saline 4. Dressing Applied: Promogran 5. Secondary Dressing Applied Bordered Foam Dressing Electronic Signature(s) Signed: 08/06/2016 5:35:18 PM By: Regan Lemming BSN, RN Entered By: Regan Lemming on 08/06/2016 10:26:47 Mike Smith ([id number]) -------------------------------------------------------------------------------- Wound Assessment Details Mike Smith Date of Service: 08/06/2016 10:00 AM Patient Name: Mike Mages. Patient Account Number: [id number] Medical Record Treating RN: Mike Gouty RN, BSN, Velva Harman [id number] Number: Other Clinician: Date of Birth/Sex: February 09, 1928 (82 y.o. Male) Treating Mike Smith Primary Care Physician: Mike Smith Physician/Extender: G Referring Physician: Olga Smith in Treatment: 3 Wound Status Wound Number: 2 Primary Etiology: Pressure Ulcer Wound  Location: Left Gluteus Wound Status: Healed - Epithelialized Wounding Event: Pressure Injury Comorbid History: Cataracts, Osteoarthritis Date Acquired: 02/25/2016 Weeks Of Treatment: 3 Clustered Wound: No Photos Photo Uploaded By: Regan Lemming on 08/06/2016 17:07:35 Wound Measurements Length: (cm) 0 % Reduction in  Width: (cm) 0 % Reduction in Depth: (cm) 0 Epithelializat Area: (cm) 0 Tunneling: Volume: (cm) 0 Undermining: Area: 100% Volume: 100% ion: Large (67-100%) No No Wound Description Classification: Category/Stage II Wound Margin: Flat and Intact Exudate Amount: None Present Wound Bed Granulation Amount: None Present (0%) Exposed Structure Necrotic Amount: None Present (0%) Fascia Exposed: No Fat Layer Exposed: No Tendon Exposed: No Muscle Exposed: No Joint Exposed: No Bone Exposed: No Mike Smith, Mike Smith ([id number]) Limited to Skin Breakdown Periwound Skin Texture Texture Color No Abnormalities Noted: No No Abnormalities Noted: No Callus: No Atrophie Blanche: No Crepitus: No Cyanosis: No Excoriation: No Ecchymosis: No Fluctuance: No Erythema: No Friable: No Hemosiderin Staining: No Induration: No Mottled: No Localized Edema: No Pallor: No Rash: No Rubor: No Scarring: No Moisture No Abnormalities Noted: No Dry / Scaly: Yes Maceration: No Moist: No Wound Preparation Ulcer Cleansing: Rinsed/Irrigated with Saline Topical Anesthetic Applied: None Electronic Signature(s) Signed: 08/06/2016 5:35:18 PM By: Regan Lemming BSN, RN Entered By: Regan Lemming on 08/06/2016 10:27:02 Mike Smith ([id number]) -------------------------------------------------------------------------------- Vitals Details Mike Smith Date of Service: 08/06/2016 10:00 AM Patient Name: Mike Mages. Patient Account Number: [id number] Medical Record Treating RN: Mike Gouty RN, BSN, Velva Harman [id number] Number: Other Clinician: Date of Birth/Sex: 02/26/1928 (82 y.o. Male) Treating  Mike Smith Primary Care Physician: Mike Smith Physician/Extender: G Referring Physician: Olga Smith in Treatment: 3 Vital Signs Time Taken: 10:18 Temperature (F): 97.7 Height (in): 72 Pulse (bpm): 64 Weight (lbs): 224 Respiratory Rate (breaths/min): 17 Body Mass Index (BMI): 30.4 Blood Pressure (mmHg): 137/63 Reference Range: 80 - 120 mg / dl Electronic Signature(s) Signed: 08/06/2016 5:35:18 PM By: Regan Lemming BSN, RN Entered By: Regan Lemming on 08/06/2016 10:21:46

## 2016-08-20 ENCOUNTER — Ambulatory Visit: Payer: Medicare Other | Admitting: Nurse Practitioner

## 2016-08-26 ENCOUNTER — Ambulatory Visit: Payer: Medicare Other | Admitting: Internal Medicine

## 2016-09-09 ENCOUNTER — Encounter: Payer: Medicare Other | Attending: Internal Medicine | Admitting: Internal Medicine

## 2016-09-09 DIAGNOSIS — L89312 Pressure ulcer of right buttock, stage 2: Secondary | ICD-10-CM | POA: Insufficient documentation

## 2016-09-09 DIAGNOSIS — N183 Chronic kidney disease, stage 3 (moderate): Secondary | ICD-10-CM | POA: Insufficient documentation

## 2016-09-09 DIAGNOSIS — L89322 Pressure ulcer of left buttock, stage 2: Secondary | ICD-10-CM | POA: Diagnosis not present

## 2016-09-10 NOTE — Progress Notes (Signed)
Mike Smith, Mike Smith ([id number]) Visit Report for 09/09/2016 Chief Complaint Document Details Mike Smith Date of Service: 09/09/2016 2:15 PM Patient Name: [name] Account Number: [id number] Medical Record Treating RN: Ahmed Prima [id number] Number: Other Clinician: Date of Birth/Sex: [birth year] (82 y.o. Male) Treating Dellia Nims, [place] Primary Care Physician/Extender: Delorise Royals, John Physician: Referring Physician: Olga Millers in Treatment: 8 Information Obtained from: Patient Chief Complaint bilateral gluteal pressure ulcers Electronic Signature(s) Signed: 09/09/2016 6:11:07 PM By: Linton Ham MD Entered By: Linton Ham on 09/09/2016 15:19:49 Randa Lynn ([id number]) -------------------------------------------------------------------------------- HPI Details Mike Smith Date of Service: 09/09/2016 2:15 PM Patient Name: Mike Smith. Patient Account Number: [id number] Medical Record Treating RN: Ahmed Prima [id number] Number: Other Clinician: Date of Birth/Sex: 1927-11-22 (82 y.o. Male) Treating Dellia Nims, Kierre Hintz Primary Care Physician/Extender: Delorise Royals, John Physician: Referring Physician: Olga Millers in Treatment: 8 History of Present Illness Location: Bilateral gluteal region Quality: Patient describes the pain as a sore feeling when he applies pressure to the gluteal region. Severity: 3 out of 10 Duration: Patient tells me this is been present for longer than a year and that the ulcers tend to open and close over time. Timing: Pain occurs mainly with manipulation of the wound and specifically with sitting in certain positions that apply pressure to the wound did region. Context: Gradually as result of patient's sitting in his recliner extended periods of time. Modifying Factors: Up to this point patient has been using benzoin spray and the wound has mainly been left open to air at this point. Associated Signs and  Symptoms: Patient has chronic renal failure stage III, cataracts, a basal cell carcinoma which has been removed from his right ear. HPI Description: 07/15/16 Patient presents today for initial evaluation of the wound center concerning bilateral gluteal pressure ulcers which she tells me are giving him trouble at this point in time. This was noted by his primary care provider who then referred him to [place]. Overall he has not really seen anyone from a wound care perspective up to this point. fortunately he is able to get up and move on his own however during shifting and moving he does experience some sheer friction to the gluteal region which I believe may be contributing to this as well. 08/06/16; this patient was admitted to the clinic a few weeks ago but hasn't been seen since I think mostly for logistic reasons/plans elations in any case this is an elderly man who has bilateral gluteal pressure areas has been using hydrogel to this at home. In further questioning it turns out that this man sits a lot during the day and sleeps in a lift chair. I think this sleeping at night and a lift chair is probably what's behind the continued pressure in this area. 09/09/16; the patient has not been seen here in over a month. Initially he had a small wound in the lower coccyx area and a cluster of small wounds on the right buttock. I think this was mostly because of sleeping in a lift chair at night advised against this he is apparently in a bed and keeping the pressure off the wounds by shifting from side to side. Keeping the weight over his upper thighs and ischial tuberosities is difficult during the day. The area covering the areas with border foam-type dressings Electronic Signature(s) Signed: 09/09/2016 6:11:07 PM By: Linton Ham MD Entered By: Linton Ham on 09/09/2016 15:22:05 Randa Lynn  ([id number]) -------------------------------------------------------------------------------- Physical Exam Details Mike Smith Date of  Service: 09/09/2016 2:15 PM Patient Name: [name] Account Number: [id number] Medical Record Treating RN: Ahmed Prima [id number] Number: Other Clinician: Date of Birth/Sex: [birth year] (82 y.o. Male) Treating Dellia Nims, Jed Kutch Primary Care Physician/Extender: Delorise Royals, John Physician: Referring Physician: Olga Millers in Treatment: 8 Constitutional Sitting or standing Blood Pressure is within target range for patient.. Pulse regular and within target range for patient.Marland Kitchen Respirations regular, non-labored and within target range.. Temperature is normal and within the target range for the patient.. Patient's appearance is neat and clean. Appears in no acute distress. Well nourished and well developed.. Notes Wound exam; the patient has healed the area that was in his coccyx. The ischial tuberosity areas are also closed the. There is still some tape injury I think from the foam they're putting on this area however everything here is largely closed over Electronic Signature(s) Signed: 09/09/2016 6:11:07 PM By: Linton Ham MD Entered By: Linton Ham on 09/09/2016 15:24:10 Randa Lynn ([id number]) -------------------------------------------------------------------------------- Physician Orders Details Mike Smith Date of Service: 09/09/2016 2:15 PM Patient Name: Mike Smith. Patient Account Number: [id number] Medical Record Treating RN: Ahmed Prima [id number] Number: Other Clinician: Date of Birth/Sex: 01-23-1928 (82 y.o. Male) Treating Dellia Nims, Nawal Burling Primary Care Physician/Extender: Delorise Royals, John Physician: Referring Physician: Olga Millers in Treatment: 8 Verbal / Phone Orders: Yes Clinician: Carolyne Fiscal, Debi Read Back and Verified: Yes Diagnosis Coding ICD-10 Coding Code Description P95.093  Pressure ulcer of right buttock, stage 2 L89.322 Pressure ulcer of left buttock, stage 2 N18.3 Chronic kidney disease, stage 3 (moderate) Discharge From [facility] Services o Discharge from [place] - Please contact our office if you have any questions or concerns. Electronic Signature(s) Signed: 09/09/2016 5:55:17 PM By: Alric Quan Signed: 09/09/2016 6:11:07 PM By: Linton Ham MD Entered By: Alric Quan on 09/09/2016 15:21:14 Randa Lynn ([id number]) -------------------------------------------------------------------------------- Problem List Details Mike Smith Date of Service: 09/09/2016 2:15 PM Patient Name: Mike Smith. Patient Account Number: [id number] Medical Record Treating RN: Ahmed Prima [id number] Number: Other Clinician: Date of Birth/Sex: Sep 04, 1928 (82 y.o. Male) Treating Dellia Nims, [place] Primary Care Physician/Extender: Delorise Royals, John Physician: Referring Physician: Olga Millers in Treatment: 8 Active Problems ICD-10 Encounter Code Description Active Date Diagnosis L89.312 Pressure ulcer of right buttock, stage 2 07/15/2016 Yes L89.322 Pressure ulcer of left buttock, stage 2 07/15/2016 Yes N18.3 Chronic kidney disease, stage 3 (moderate) 07/15/2016 Yes Inactive Problems Resolved Problems Electronic Signature(s) Signed: 09/09/2016 6:11:07 PM By: Linton Ham MD Entered By: Linton Ham on 09/09/2016 15:19:23 Randa Lynn ([id number]) -------------------------------------------------------------------------------- Progress Note Details Mike Smith Date of Service: 09/09/2016 2:15 PM Patient Name: Mike Smith. Patient Account Number: [id number] Medical Record Treating RN: Ahmed Prima [id number] Number: Other Clinician: Date of Birth/Sex: 09/20/1928 (82 y.o. Male) Treating Dellia Nims, Arrayah Connors Primary Care Physician/Extender: Delorise Royals, John Physician: Referring Physician: Olga Millers in Treatment:  8 Subjective Chief Complaint Information obtained from Patient bilateral gluteal pressure ulcers History of Present Illness (HPI) The following HPI elements were documented for the patient's wound: Location: Bilateral gluteal region Quality: Patient describes the pain as a sore feeling when he applies pressure to the gluteal region. Severity: 3 out of 10 Duration: Patient tells me this is been present for longer than a year and that the ulcers tend to open and close over time. Timing: Pain occurs mainly with manipulation of the wound and specifically with sitting in certain positions that apply pressure to the wound did region. Context: Gradually as result of patient's sitting  in his recliner extended periods of time. Modifying Factors: Up to this point patient has been using benzoin spray and the wound has mainly been left open to air at this point. Associated Signs and Symptoms: Patient has chronic renal failure stage III, cataracts, a basal cell carcinoma which has been removed from his right ear. 07/15/16 Patient presents today for initial evaluation of the wound center concerning bilateral gluteal pressure ulcers which she tells me are giving him trouble at this point in time. This was noted by his primary care provider who then referred him to [place]. Overall he has not really seen anyone from a wound care perspective up to this point. fortunately he is able to get up and move on his own however during shifting and moving he does experience some sheer friction to the gluteal region which I believe may be contributing to this as well. 08/06/16; this patient was admitted to the clinic a few weeks ago but hasn't been seen since I think mostly for logistic reasons/plans elations in any case this is an elderly man who has bilateral gluteal pressure areas has been using hydrogel to this at home. In further questioning it turns out that this man sits a lot during the day and sleeps in a lift  chair. I think this sleeping at night and a lift chair is probably what's behind the continued pressure in this area. 09/09/16; the patient has not been seen here in over a month. Initially he had a small wound in the lower coccyx area and a cluster of small wounds on the right buttock. I think this was mostly because of sleeping in a lift chair at night advised against this he is apparently in a bed and keeping the pressure off the wounds by shifting from side to side. Keeping the weight over his upper thighs and ischial tuberosities is difficult during the day. The area covering the areas with border foam-type dressings Mike Smith, Mike Smith. ([id number]) Objective Constitutional Sitting or standing Blood Pressure is within target range for patient.. Pulse regular and within target range for patient.Marland Kitchen Respirations regular, non-labored and within target range.. Temperature is normal and within the target range for the patient.. Patient's appearance is neat and clean. Appears in no acute distress. Well nourished and well developed.. Vitals Time Taken: 2:54 PM, Height: 72 in, Weight: 224 lbs, BMI: 30.4, Temperature: 97.5 F, Pulse: 71 bpm, Respiratory Rate: 16 breaths/min, Blood Pressure: 117/61 mmHg. General Notes: Wound exam; the patient has healed the area that was in his coccyx. The ischial tuberosity areas are also closed the. There is still some tape injury I think from the foam they're putting on this area however everything here is largely closed over Integumentary (Hair, Skin) Wound #1 status is Healed - Epithelialized. Original cause of wound was Gradually Appeared. The wound is located on the Right Gluteus. The wound measures 0cm length x 0cm width x 0cm depth; 0cm^2 area and 0cm^3 volume. Assessment Active Problems ICD-10 L89.312 - Pressure ulcer of right buttock, stage 2 L89.322 - Pressure ulcer of left buttock, stage 2 N18.3 - Chronic kidney disease, stage 3  (moderate) Plan Discharge From [facility] Services: Discharge from [place] - Please contact our office if you have any questions or concerns. Mike Smith, Mike Smith [place]. ([id number]) all the areas are closed over. I thing the careful attention to pressure offloading was all that was really required here, He no longer sleeping in a lift chair the foam based cover ot the problamatic areas  on the buttocks also seem reasonable can be discharged from the clinic Electronic Signature(s) Signed: 09/09/2016 6:11:07 PM By: Linton Ham MD Entered By: Linton Ham on 09/09/2016 15:29:00 Randa Lynn ([id number]) -------------------------------------------------------------------------------- SuperBill Details Mike Smith Date of Service: 09/09/2016 Patient Name: Mike Smith. Patient Account Number: [id number] Medical Record Treating RN: Ahmed Prima [id number] Number: Other Clinician: Date of Birth/Sex: 1928-10-09 (82 y.o. Male) Treating Dellia Nims, [place] Primary Care Physician/Extender: Delorise Royals, John Physician: Suella Grove in Treatment: 8 Referring Physician: Lynett Fish Diagnosis Coding ICD-10 Codes Code Description [phone] Pressure ulcer of right buttock, stage 2 L89.322 Pressure ulcer of left buttock, stage 2 N18.3 Chronic kidney disease, stage 3 (moderate) Facility Procedures CPT4 Code: [id number] Description: [phone] - WOUND CARE VISIT-LEV 2 EST PT Modifier: Quantity: 1 Physician Procedures CPT4 Code: [id number] Description: [id number] - WC PHYS LEVEL 2 - EST PT ICD-10 Description Diagnosis L89.312 Pressure ulcer of right buttock, stage 2 L89.322 Pressure ulcer of left buttock, stage 2 Modifier: Quantity: 1 Electronic Signature(s) Signed: 09/09/2016 5:55:17 PM By: Alric Quan Signed: 09/09/2016 6:11:07 PM By: Linton Ham MD Entered By: Alric Quan on 09/09/2016 15:42:04

## 2016-09-10 NOTE — Progress Notes (Signed)
Mike Smith, Mike Smith ([id number]) Visit Report for 09/09/2016 Arrival Information Details Mike Smith Date of Service: 09/09/2016 2:15 PM Patient Name: [name] Account Number: [id number] Medical Record Treating RN: Ahmed Prima [id number] Number: Other Clinician: Date of Birth/Sex: 09/18/1928 (82 y.o. Male) Treating Linton Ham Primary Care Physician: Lynett Fish Physician/Extender: G Referring Physician: Olga Millers in Treatment: 8 Visit Information History Since Last Visit All ordered tests and consults were completed: No Patient Arrived: Ambulatory Added or deleted any medications: No Arrival Time: 15:50 Any new allergies or adverse reactions: No Accompanied By: son Had a fall or experienced change in No Transfer Assistance: None activities of daily living that may affect Patient Identification Verified: Yes risk of falls: Secondary Verification Process Yes Signs or symptoms of abuse/neglect since last No Completed: visito Patient Requires Transmission-Based No Hospitalized since last visit: No Precautions: Pain Present Now: No Patient Has Alerts: Yes Patient Alerts: NOT Diabetic Electronic Signature(s) Signed: 09/09/2016 5:55:17 PM By: Alric Quan Entered By: Alric Quan on 09/09/2016 15:02:24 Mike Smith ([id number]) -------------------------------------------------------------------------------- Clinic Level of Care Assessment Details Mike Smith Date of Service: 09/09/2016 2:15 PM Patient Name: [name] Account Number: [id number] Medical Record Treating RN: Ahmed Prima [id number] Number: Other Clinician: Date of Birth/Sex: [birth year] (82 y.o. Male) Treating Dellia Nims, [place] Primary Care Physician: Lynett Fish Physician/Extender: G Referring Physician: Olga Millers in Treatment: 8 Clinic Level of Care Assessment Items TOOL 4 Quantity Score X - Use when only an EandM is performed on  FOLLOW-UP visit 1 0 ASSESSMENTS - Nursing Assessment / Reassessment X - Reassessment of Co-morbidities (includes updates in patient status) 1 10 X - Reassessment of Adherence to Treatment Plan 1 5 ASSESSMENTS - Wound and Skin Assessment / Reassessment X - Simple Wound Assessment / Reassessment - one wound 1 5 '[]'$  - Complex Wound Assessment / Reassessment - multiple wounds 0 '[]'$  - Dermatologic / Skin Assessment (not related to wound area) 0 ASSESSMENTS - Focused Assessment '[]'$  - Circumferential Edema Measurements - multi extremities 0 '[]'$  - Nutritional Assessment / Counseling / Intervention 0 '[]'$  - Lower Extremity Assessment (monofilament, tuning fork, pulses) 0 '[]'$  - Peripheral Arterial Disease Assessment (using hand held doppler) 0 ASSESSMENTS - Ostomy and/or Continence Assessment and Care '[]'$  - Incontinence Assessment and Management 0 '[]'$  - Ostomy Care Assessment and Management (repouching, etc.) 0 PROCESS - Coordination of Care X - Simple Patient / Family Education for ongoing care 1 15 '[]'$  - Complex (extensive) Patient / Family Education for ongoing care 0 X - Staff obtains Programmer, systems, Records, Test Results / Process Orders 1 10 '[]'$  - Staff telephones HHA, Nursing Homes / Clarify orders / etc 0 JAS, BETTEN. ([id number]) '[]'$  - Routine Transfer to another Facility (non-emergent condition) 0 '[]'$  - Routine Hospital Admission (non-emergent condition) 0 '[]'$  - New Admissions / Biomedical engineer / Ordering NPWT, Apligraf, etc. 0 '[]'$  - Emergency Hospital Admission (emergent condition) 0 X - Simple Discharge Coordination 1 10 '[]'$  - Complex (extensive) Discharge Coordination 0 PROCESS - Special Needs '[]'$  - Pediatric / Minor Patient Management 0 '[]'$  - Isolation Patient Management 0 '[]'$  - Hearing / Language / Visual special needs 0 '[]'$  - Assessment of Community assistance (transportation, D/C planning, etc.) 0 '[]'$  - Additional assistance / Altered mentation 0 '[]'$  - Support Surface(s) Assessment (bed,  cushion, seat, etc.) 0 INTERVENTIONS - Wound Cleansing / Measurement '[]'$  - Simple Wound Cleansing - one wound 0 '[]'$  - Complex Wound Cleansing - multiple wounds 0 X - Wound  Imaging (photographs - any number of wounds) 1 5 '[]'$  - Wound Tracing (instead of photographs) 0 '[]'$  - Simple Wound Measurement - one wound 0 '[]'$  - Complex Wound Measurement - multiple wounds 0 INTERVENTIONS - Wound Dressings '[]'$  - Small Wound Dressing one or multiple wounds 0 '[]'$  - Medium Wound Dressing one or multiple wounds 0 '[]'$  - Large Wound Dressing one or multiple wounds 0 '[]'$  - Application of Medications - topical 0 '[]'$  - Application of Medications - injection 0 Mike Smith, Mike Smith ([id number]) INTERVENTIONS - Miscellaneous '[]'$  - External ear exam 0 '[]'$  - Specimen Collection (cultures, biopsies, blood, body fluids, etc.) 0 '[]'$  - Specimen(s) / Culture(s) sent or taken to Lab for analysis 0 '[]'$  - Patient Transfer (multiple staff / Harrel Lemon Lift / Similar devices) 0 '[]'$  - Simple Staple / Suture removal (25 or less) 0 '[]'$  - Complex Staple / Suture removal (26 or more) 0 '[]'$  - Hypo / Hyperglycemic Management (close monitor of Blood Glucose) 0 '[]'$  - Ankle / Brachial Index (ABI) - do not check if billed separately 0 X - Vital Signs 1 5 Has the patient been seen at the hospital within the last three years: Yes Total Score: 65 Level Of Care: New/Established - Level 2 Electronic Signature(s) Signed: 09/09/2016 5:55:17 PM By: Alric Quan Entered By: Alric Quan on 09/09/2016 15:41:56 Mike Smith ([id number]) -------------------------------------------------------------------------------- Encounter Discharge Information Details Mike Smith Date of Service: 09/09/2016 2:15 PM Patient Name: Mike Smith. Patient Account Number: [id number] Medical Record Treating RN: Ahmed Prima [id number] Number: Other Clinician: Date of Birth/Sex: 1928-04-23 (82 y.o. Male) Treating Linton Ham Primary Care Physician: Lynett Fish Physician/Extender: G Referring Physician: Olga Millers in Treatment: 8 Encounter Discharge Information Items Discharge Pain Level: 0 Discharge Condition: Stable Ambulatory Status: Ambulatory Discharge Destination: Home Transportation: Private Auto Accompanied By: son Schedule Follow-up Appointment: No Medication Reconciliation completed and provided to Patient/Care Yes Denelle Capurro: Provided on Clinical Summary of Care: 09/09/2016 Form Type Recipient Paper Patient DF Electronic Signature(s) Signed: 09/09/2016 5:55:17 PM By: Alric Quan Previous Signature: 09/09/2016 3:21:47 PM Version By: Ruthine Dose Entered By: Alric Quan on 09/09/2016 15:39:17 Mike Smith ([id number]) -------------------------------------------------------------------------------- Lower Extremity Assessment Details Mike Smith Date of Service: 09/09/2016 2:15 PM Patient Name: Mike Smith. Patient Account Number: [id number] Medical Record Treating RN: Ahmed Prima [id number] Number: Other Clinician: Date of Birth/Sex: Mar 12, 1928 (82 y.o. Male) Treating Dellia Nims, [place] Primary Care Physician: Lynett Fish Physician/Extender: G Referring Physician: Olga Millers in Treatment: 8 Electronic Signature(s) Signed: 09/09/2016 5:55:17 PM By: Alric Quan Entered By: Alric Quan on 09/09/2016 15:03:00 Mike Smith ([id number]) -------------------------------------------------------------------------------- Multi Wound Chart Details Mike Smith Date of Service: 09/09/2016 2:15 PM Patient Name: Mike Smith. Patient Account Number: [id number] Medical Record Treating RN: Ahmed Prima [id number] Number: Other Clinician: Date of Birth/Sex: 01-01-1928 (82 y.o. Male) Treating Linton Ham Primary Care Physician: Lynett Fish Physician/Extender: G Referring Physician: Olga Millers in Treatment: 8 Vital Signs Height(in): 72 Pulse(bpm):  71 Weight(lbs): 224 Blood Pressure 117/61 (mmHg): Body Mass Index(BMI): 30 Temperature(F): 97.5 Respiratory Rate 16 (breaths/min): Wound Assessments Treatment Notes Electronic Signature(s) Signed: 09/09/2016 5:55:17 PM By: Alric Quan Entered By: Alric Quan on 09/09/2016 15:03:14 Mike Smith ([id number]) -------------------------------------------------------------------------------- Multi-Disciplinary Care Plan Details Mike Smith Date of Service: 09/09/2016 2:15 PM Patient Name: [name] Account Number: [id number] Medical Record Treating RN: Ahmed Prima [id number] Number: Other Clinician: Date of Birth/Sex: 09-03-1928 (82 y.o. Male) Treating Linton Ham Primary Care Physician: Lynett Fish Physician/Extender: G Referring  Physician: Olga Millers in Treatment: 8 Active Inactive Electronic Signature(s) Signed: 09/09/2016 5:55:17 PM By: Alric Quan Entered By: Alric Quan on 09/09/2016 16:49:27 Mike Smith ([id number]) -------------------------------------------------------------------------------- Pain Assessment Details Mike Smith Date of Service: 09/09/2016 2:15 PM Patient Name: Mike Smith. Patient Account Number: [id number] Medical Record Treating RN: Ahmed Prima [id number] Number: Other Clinician: Date of Birth/Sex: 1928/09/21 (82 y.o. Male) Treating Linton Ham Primary Care Physician: Lynett Fish Physician/Extender: G Referring Physician: Olga Millers in Treatment: 8 Active Problems Location of Pain Severity and Description of Pain Patient Has Paino No Site Locations With Dressing Change: No Pain Management and Medication Current Pain Management: Electronic Signature(s) Signed: 09/09/2016 5:55:17 PM By: Alric Quan Entered By: Alric Quan on 09/09/2016 15:02:30 Mike Smith  ([id number]) -------------------------------------------------------------------------------- Patient/Caregiver Education Details Mike Smith Date of Service: 09/09/2016 2:15 PM Patient Name: Mike Smith. Patient Account Number: [id number] Medical Record Treating RN: Ahmed Prima [id number] Number: Other Clinician: Date of Birth/Gender: Jan 23, 1928 (82 y.o. Male) Treating Linton Ham Primary Care Physician: Lynett Fish Physician/Extender: G Referring Physician: Olga Millers in Treatment: 8 Education Assessment Education Provided To: Patient Education Topics Provided Wound/Skin Impairment: Handouts: Other: Please contact our office if you have any questions or concerns. Methods: Explain/Verbal Responses: State content correctly Electronic Signature(s) Signed: 09/09/2016 5:55:17 PM By: Alric Quan Entered By: Alric Quan on 09/09/2016 15:39:35 Mike Smith ([id number]) -------------------------------------------------------------------------------- Wound Assessment Details Mike Smith Date of Service: 09/09/2016 2:15 PM Patient Name: Mike Smith. Patient Account Number: [id number] Medical Record Treating RN: Ahmed Prima [id number] Number: Other Clinician: Date of Birth/Sex: Jun 14, 1928 (82 y.o. Male) Treating Linton Ham Primary Care Physician: Lynett Fish Physician/Extender: G Referring Physician: Olga Millers in Treatment: 8 Wound Status Wound Number: 1 Primary Etiology: Pressure Ulcer Wound Location: Right Gluteus Wound Status: Healed - Epithelialized Wounding Event: Gradually Appeared Date Acquired: 02/25/2016 Weeks Of Treatment: 8 Clustered Wound: No Photos Photo Uploaded By: Alric Quan on 09/09/2016 15:42:59 Wound Measurements Length: (cm) 0 % Reduction in Width: (cm) 0 % Reduction in Depth: (cm) 0 Area: (cm) 0 Volume: (cm) 0 Area: 100% Volume: 100% Wound Description Classification: Category/Stage  II Periwound Skin Texture Texture Color No Abnormalities Noted: No No Abnormalities Noted: No Moisture No Abnormalities Noted: No Electronic Signature(s) Mike Smith, Mike Smith ([id number]) Signed: 09/09/2016 5:55:17 PM By: Alric Quan Entered By: Alric Quan on 09/09/2016 15:20:38 Mike Smith ([id number]) -------------------------------------------------------------------------------- Vitals Details Mike Smith Date of Service: 09/09/2016 2:15 PM Patient Name: Mike Smith. Patient Account Number: [id number] Medical Record Treating RN: Ahmed Prima [id number] Number: Other Clinician: Date of Birth/Sex: [birth year] (82 y.o. Male) Treating Dellia Nims, [place] Primary Care Physician: Lynett Fish Physician/Extender: G Referring Physician: Olga Millers in Treatment: 8 Vital Signs Time Taken: 14:54 Temperature (F): 97.5 Height (in): 72 Pulse (bpm): 71 Weight (lbs): 224 Respiratory Rate (breaths/min): 16 Body Mass Index (BMI): 30.4 Blood Pressure (mmHg): 117/61 Reference Range: 80 - 120 mg / dl Electronic Signature(s) Signed: 09/09/2016 5:55:17 PM By: Alric Quan Entered By: Alric Quan on 09/09/2016 15:02:54

## 2017-03-13 ENCOUNTER — Other Ambulatory Visit: Payer: Self-pay | Admitting: Internal Medicine

## 2017-03-13 DIAGNOSIS — R269 Unspecified abnormalities of gait and mobility: Secondary | ICD-10-CM

## 2017-03-25 ENCOUNTER — Ambulatory Visit
Admission: RE | Admit: 2017-03-25 | Discharge: 2017-03-25 | Disposition: A | Discharge status: Home / Self Care | Payer: Medicare Other | Source: Ambulatory Visit | Attending: Internal Medicine | Admitting: Internal Medicine

## 2017-03-25 DIAGNOSIS — R269 Unspecified abnormalities of gait and mobility: Secondary | ICD-10-CM | POA: Diagnosis present

## 2017-03-25 DIAGNOSIS — G319 Degenerative disease of nervous system, unspecified: Secondary | ICD-10-CM | POA: Diagnosis not present

## 2017-03-25 DIAGNOSIS — I6782 Cerebral ischemia: Secondary | ICD-10-CM | POA: Diagnosis not present

## 2017-03-31 ENCOUNTER — Ambulatory Visit: Payer: Medicare Other

## 2017-06-24 ENCOUNTER — Ambulatory Visit: Payer: Medicare Other | Admitting: Internal Medicine

## 2018-10-19 DIAGNOSIS — J849 Interstitial pulmonary disease, unspecified: Secondary | ICD-10-CM | POA: Diagnosis present

## 2019-03-13 ENCOUNTER — Emergency Department
Admission: EM | Admit: 2019-03-13 | Discharge: 2019-03-13 | Disposition: A | Discharge status: Home / Self Care | Payer: Medicare Other | Attending: Emergency Medicine | Admitting: Emergency Medicine

## 2019-03-13 ENCOUNTER — Emergency Department: Payer: Medicare Other

## 2019-03-13 ENCOUNTER — Other Ambulatory Visit: Payer: Self-pay

## 2019-03-13 DIAGNOSIS — W109XXA Fall (on) (from) unspecified stairs and steps, initial encounter: Secondary | ICD-10-CM | POA: Diagnosis not present

## 2019-03-13 DIAGNOSIS — Y999 Unspecified external cause status: Secondary | ICD-10-CM | POA: Insufficient documentation

## 2019-03-13 DIAGNOSIS — S4992XA Unspecified injury of left shoulder and upper arm, initial encounter: Secondary | ICD-10-CM | POA: Diagnosis present

## 2019-03-13 DIAGNOSIS — S42252A Displaced fracture of greater tuberosity of left humerus, initial encounter for closed fracture: Secondary | ICD-10-CM | POA: Insufficient documentation

## 2019-03-13 DIAGNOSIS — Y939 Activity, unspecified: Secondary | ICD-10-CM | POA: Diagnosis not present

## 2019-03-13 DIAGNOSIS — Y929 Unspecified place or not applicable: Secondary | ICD-10-CM | POA: Insufficient documentation

## 2019-03-13 DIAGNOSIS — W19XXXA Unspecified fall, initial encounter: Secondary | ICD-10-CM

## 2019-03-13 DIAGNOSIS — S42212A Unspecified displaced fracture of surgical neck of left humerus, initial encounter for closed fracture: Secondary | ICD-10-CM

## 2019-03-13 MED ORDER — ONDANSETRON HCL 4 MG/2ML IJ SOLN
4.0000 mg | Freq: Once | INTRAMUSCULAR | Status: AC
  Administered 2019-03-13: 4 mg via INTRAVENOUS

## 2019-03-13 MED ORDER — MORPHINE SULFATE (PF) 4 MG/ML IV SOLN
INTRAVENOUS | Status: AC
  Administered 2019-03-13: 4 mg via INTRAVENOUS
  Filled 2019-03-13: qty 1

## 2019-03-13 MED ORDER — OXYCODONE-ACETAMINOPHEN 5-325 MG PO TABS: 1.0000 | ORAL_TABLET | ORAL | 0 refills | Status: AC | PRN

## 2019-03-13 MED ORDER — MORPHINE SULFATE (PF) 4 MG/ML IV SOLN
4.0000 mg | Freq: Once | INTRAVENOUS | Status: AC
  Administered 2019-03-13: 4 mg via INTRAVENOUS

## 2019-03-13 MED ORDER — ONDANSETRON HCL 4 MG/2ML IJ SOLN
INTRAMUSCULAR | Status: AC
  Administered 2019-03-13: 4 mg via INTRAVENOUS
  Filled 2019-03-13: qty 2

## 2019-03-13 NOTE — ED Notes (Signed)
Patient to x-ray via wheel chair

## 2019-03-13 NOTE — ED Provider Notes (Signed)
[facility] Emergency Department Provider Note   ____________________________________________   First MD Initiated Contact with Patient 03/13/19 0945     (approximate)  I have reviewed the triage vital signs and the nursing notes.   HISTORY  Chief Complaint Fall and Shoulder Pain   HPI Zakaree Mcclenahan is a 85 y.o. male who lost his balance and fell down the steps.  He did not hit his head.  He did not pass out.  He has no headache or neck pain.  He only has pain in his shoulder.  He has good range of motion and strength in his hand but it hurts to move his shoulder.  No other injuries.  No chest pain back pain belly pain etc.      No past medical history on file. ILD (interstitial lung disease) 10/19/2018  Basal cell carcinoma of skin of right ear and external auricular canal 12/13/2015  Basal cell carcinoma of right ear 12/13/2015  CRF (chronic renal failure), stage 3 (moderate) 06/21/2015  Essential hypertension, benign 03/19/2015  Hypercholesteremia 03/19/2015  History of palpitations     There are no active problems to display for this patient.   Prior to Admission medications   Not on File    Allergies Patient has no known allergies.  No family history on file.  Social History Social History   Tobacco Use  . Smoking status: Not on file  Substance Use Topics  . Alcohol use: Not on file  . Drug use: Not on file    Review of Systems  Constitutional: No fever/chills Eyes: No visual changes. ENT: No sore throat. Cardiovascular: Denies chest pain. Respiratory: Denies shortness of breath. Gastrointestinal: No abdominal pain.  No nausea, no vomiting.  No diarrhea.  No constipation. Genitourinary: Negative for dysuria. Musculoskeletal: Negative for back pain. Skin: Negative for rash. Neurological: Negative for headaches, focal weakness  ____________________________________________   PHYSICAL EXAM:  VITAL SIGNS:  ED Triage Vitals  Enc Vitals Group     BP 03/13/19 0623 134/78     Pulse Rate 03/13/19 0623 87     Resp 03/13/19 0623 18     Temp 03/13/19 0623 97.8 F (36.6 C)     Temp Source 03/13/19 0623 Oral     SpO2 03/13/19 0623 95 %     Weight 03/13/19 0620 200 lb (90.7 kg)     Height 03/13/19 0620 6' (1.829 m)     Head Circumference --      Peak Flow --      Pain Score 03/13/19 0620 5     Pain Loc --      Pain Edu? --      Excl. in [place]? --     Constitutional: Alert and oriented. Well appearing and in no acute distress. Eyes: Conjunctivae are normal. PERRL. EOMI. Head: Atraumatic. Nose: No congestion/rhinnorhea. Mouth/Throat: Mucous membranes are moist.  Oropharynx non-erythematous. Neck: No stridor.   Cardiovascular: Normal rate, regular rhythm. Grossly normal heart sounds.  Good peripheral circulation. Respiratory: Normal respiratory effort.  No retractions. Lungs CTAB. Gastrointestinal: Soft and nontender. No distention. No abdominal bruits. No CVA tenderness. Musculoskeletal: No lower extremity tenderness nor edema.  There is tenderness in the left shoulder.  Neurovascularly intact in the hand and wrist no numbness in the shoulder. Neurologic:  Normal speech and language. No gross focal neurologic deficits are appreciated.  Skin:  Skin is warm, dry and intact. No rash noted. Psychiatric: Mood and affect are normal. Speech and behavior  are normal.  ____________________________________________   LABS (all labs ordered are listed, but only abnormal results are displayed)  Labs Reviewed - No data to display ____________________________________________  EKG   ____________________________________________  RADIOLOGY  ED MD interpretation: X-ray read by radiology reviewed by me shows a greater tuberosity fracture also appears to be a fracture line extending across the surgical neck.  I reviewed these films with Dr. Youlanda Mighty orthopedics as well.  He agrees.  Official radiology  report(s): Dg Shoulder Left  Result Date: 03/13/2019 CLINICAL DATA:  Fall, left shoulder pain EXAM: LEFT SHOULDER - 2+ VIEW COMPARISON:  None. FINDINGS: Greater tuberosity fracture, minimally displaced. No evidence of dislocation. Glenoid rim appears intact. Mild inferior subluxation, suggesting hemarthrosis. The visualized soft tissues are unremarkable. Visualized left lung is clear. IMPRESSION: Greater tuberosity fracture, mildly displaced. Glenoid rim appears intact. Mild inferior subluxation, suggesting hemarthrosis. Electronically Signed   By: Julian Hy M.D.   On: 03/13/2019 07:10    ____________________________________________   PROCEDURES  Procedure(s) performed (including Critical Care):  Procedures   ____________________________________________   INITIAL IMPRESSION / ASSESSMENT AND PLAN / ED COURSE  After discussing this with Dr. Rudene Christians we will put him in a shoulder immobilizer.  Pain medication as needed follow-up with Dr. Hildred Priest in about a week.              ____________________________________________   FINAL CLINICAL IMPRESSION(S) / ED DIAGNOSES  Final diagnoses:  Fall, initial encounter  Closed displaced fracture of surgical neck of left humerus, unspecified fracture morphology, initial encounter     ED Discharge Orders    None       Note:  This document was prepared using Dragon voice recognition software and may include unintentional dictation errors.    Nena Polio, MD 03/13/19 1201

## 2019-03-13 NOTE — ED Triage Notes (Signed)
Patient reports he was walking and turned to soon and fell down the stairs (approximately 6 or 7), denies hitting head or loss of consciousness.  Patient reports having left shoulder pain.

## 2019-03-13 NOTE — ED Notes (Signed)
Son updated via phone.

## 2019-03-13 NOTE — Discharge Instructions (Addendum)
Please wear the shoulder immobilizer.  Do not take it off.  Follow-up with Dr. Rudene Christians, the orthopedic surgeon.  Call his office Monday morning get an appointment toward the end of the week.  Return here for any increasing pain numbness or blueness of the hand.  Take Percocet as needed for pain 1 pill 4 times a day.  Be careful can make you constipated and woozy.  Drive on it.

## 2020-01-30 DIAGNOSIS — G20 Parkinson's disease: Secondary | ICD-10-CM | POA: Diagnosis present

## 2020-05-06 ENCOUNTER — Inpatient Hospital Stay
Admission: EM | Admit: 2020-05-06 | Discharge: 2020-05-15 | DRG: 180 | Disposition: A | Discharge status: Hospice | Payer: Medicare Other | Source: Ambulatory Visit | Attending: Internal Medicine | Admitting: Internal Medicine

## 2020-05-06 ENCOUNTER — Emergency Department: Payer: Medicare Other

## 2020-05-06 ENCOUNTER — Encounter: Payer: Self-pay | Admitting: Emergency Medicine

## 2020-05-06 ENCOUNTER — Other Ambulatory Visit: Payer: Self-pay

## 2020-05-06 DIAGNOSIS — Z79899 Other long term (current) drug therapy: Secondary | ICD-10-CM

## 2020-05-06 DIAGNOSIS — J690 Pneumonitis due to inhalation of food and vomit: Secondary | ICD-10-CM | POA: Diagnosis present

## 2020-05-06 DIAGNOSIS — N1832 Chronic kidney disease, stage 3b: Secondary | ICD-10-CM | POA: Diagnosis present

## 2020-05-06 DIAGNOSIS — R54 Age-related physical debility: Secondary | ICD-10-CM | POA: Diagnosis present

## 2020-05-06 DIAGNOSIS — C787 Secondary malignant neoplasm of liver and intrahepatic bile duct: Secondary | ICD-10-CM | POA: Diagnosis present

## 2020-05-06 DIAGNOSIS — R64 Cachexia: Secondary | ICD-10-CM | POA: Diagnosis present

## 2020-05-06 DIAGNOSIS — R0902 Hypoxemia: Secondary | ICD-10-CM

## 2020-05-06 DIAGNOSIS — Z7401 Bed confinement status: Secondary | ICD-10-CM

## 2020-05-06 DIAGNOSIS — C3412 Malignant neoplasm of upper lobe, left bronchus or lung: Secondary | ICD-10-CM | POA: Diagnosis present

## 2020-05-06 DIAGNOSIS — F0280 Dementia in other diseases classified elsewhere without behavioral disturbance: Secondary | ICD-10-CM | POA: Diagnosis present

## 2020-05-06 DIAGNOSIS — E43 Unspecified severe protein-calorie malnutrition: Secondary | ICD-10-CM | POA: Diagnosis present

## 2020-05-06 DIAGNOSIS — R778 Other specified abnormalities of plasma proteins: Secondary | ICD-10-CM

## 2020-05-06 DIAGNOSIS — L8990 Pressure ulcer of unspecified site, unspecified stage: Secondary | ICD-10-CM | POA: Insufficient documentation

## 2020-05-06 DIAGNOSIS — N183 Chronic kidney disease, stage 3 unspecified: Secondary | ICD-10-CM | POA: Diagnosis present

## 2020-05-06 DIAGNOSIS — G9341 Metabolic encephalopathy: Secondary | ICD-10-CM | POA: Diagnosis present

## 2020-05-06 DIAGNOSIS — Z87891 Personal history of nicotine dependence: Secondary | ICD-10-CM

## 2020-05-06 DIAGNOSIS — L89892 Pressure ulcer of other site, stage 2: Secondary | ICD-10-CM | POA: Diagnosis present

## 2020-05-06 DIAGNOSIS — I248 Other forms of acute ischemic heart disease: Secondary | ICD-10-CM | POA: Diagnosis present

## 2020-05-06 DIAGNOSIS — Z515 Encounter for palliative care: Secondary | ICD-10-CM | POA: Diagnosis not present

## 2020-05-06 DIAGNOSIS — Z9889 Other specified postprocedural states: Secondary | ICD-10-CM

## 2020-05-06 DIAGNOSIS — Z20822 Contact with and (suspected) exposure to covid-19: Secondary | ICD-10-CM | POA: Diagnosis present

## 2020-05-06 DIAGNOSIS — I129 Hypertensive chronic kidney disease with stage 1 through stage 4 chronic kidney disease, or unspecified chronic kidney disease: Secondary | ICD-10-CM | POA: Diagnosis present

## 2020-05-06 DIAGNOSIS — Z79891 Long term (current) use of opiate analgesic: Secondary | ICD-10-CM | POA: Diagnosis not present

## 2020-05-06 DIAGNOSIS — Z66 Do not resuscitate: Secondary | ICD-10-CM | POA: Diagnosis not present

## 2020-05-06 DIAGNOSIS — I493 Ventricular premature depolarization: Secondary | ICD-10-CM | POA: Diagnosis present

## 2020-05-06 DIAGNOSIS — J90 Pleural effusion, not elsewhere classified: Secondary | ICD-10-CM

## 2020-05-06 DIAGNOSIS — J910 Malignant pleural effusion: Secondary | ICD-10-CM | POA: Diagnosis present

## 2020-05-06 DIAGNOSIS — G20 Parkinson's disease: Secondary | ICD-10-CM | POA: Diagnosis present

## 2020-05-06 DIAGNOSIS — Z6820 Body mass index (BMI) 20.0-20.9, adult: Secondary | ICD-10-CM | POA: Diagnosis not present

## 2020-05-06 DIAGNOSIS — I10 Essential (primary) hypertension: Secondary | ICD-10-CM | POA: Diagnosis present

## 2020-05-06 DIAGNOSIS — J849 Interstitial pulmonary disease, unspecified: Secondary | ICD-10-CM | POA: Diagnosis present

## 2020-05-06 DIAGNOSIS — R000 Tachycardia, unspecified: Secondary | ICD-10-CM | POA: Diagnosis present

## 2020-05-06 DIAGNOSIS — J9601 Acute respiratory failure with hypoxia: Secondary | ICD-10-CM

## 2020-05-06 DIAGNOSIS — I480 Paroxysmal atrial fibrillation: Secondary | ICD-10-CM | POA: Diagnosis present

## 2020-05-06 DIAGNOSIS — I4891 Unspecified atrial fibrillation: Secondary | ICD-10-CM | POA: Diagnosis not present

## 2020-05-06 DIAGNOSIS — R918 Other nonspecific abnormal finding of lung field: Secondary | ICD-10-CM | POA: Diagnosis not present

## 2020-05-06 DIAGNOSIS — J189 Pneumonia, unspecified organism: Secondary | ICD-10-CM

## 2020-05-06 DIAGNOSIS — Z87442 Personal history of urinary calculi: Secondary | ICD-10-CM

## 2020-05-06 DIAGNOSIS — L89322 Pressure ulcer of left buttock, stage 2: Secondary | ICD-10-CM | POA: Diagnosis present

## 2020-05-06 DIAGNOSIS — R0600 Dyspnea, unspecified: Secondary | ICD-10-CM

## 2020-05-06 HISTORY — DX: Unspecified atrial fibrillation: I48.91

## 2020-05-06 HISTORY — DX: Unspecified dementia, unspecified severity, without behavioral disturbance, psychotic disturbance, mood disturbance, and anxiety: F03.90

## 2020-05-06 LAB — COMPREHENSIVE METABOLIC PANEL
ALT: 6 U/L (ref 0–44)
AST: 31 U/L (ref 15–41)
Albumin: 2.9 g/dL — ABNORMAL LOW (ref 3.5–5.0)
Alkaline Phosphatase: 78 U/L (ref 38–126)
Anion gap: 12 (ref 5–15)
BUN: 31 mg/dL — ABNORMAL HIGH (ref 8–23)
CO2: 22 mmol/L (ref 22–32)
Calcium: 9.2 mg/dL (ref 8.9–10.3)
Chloride: 104 mmol/L (ref 98–111)
Creatinine, Ser: 1.87 mg/dL — ABNORMAL HIGH (ref 0.61–1.24)
GFR calc Af Amer: 36 mL/min — ABNORMAL LOW (ref >60)
GFR calc non Af Amer: 31 mL/min — ABNORMAL LOW (ref >60)
Glucose, Bld: 102 mg/dL — ABNORMAL HIGH (ref 70–99)
Potassium: 4.5 mmol/L (ref 3.5–5.1)
Sodium: 138 mmol/L (ref 135–145)
Total Bilirubin: 1.1 mg/dL (ref 0.3–1.2)
Total Protein: 6.3 g/dL — ABNORMAL LOW (ref 6.5–8.1)

## 2020-05-06 LAB — CBC
HCT: 34.6 % — ABNORMAL LOW (ref 39.0–52.0)
Hemoglobin: 11.9 g/dL — ABNORMAL LOW (ref 13.0–17.0)
MCH: 30.7 pg (ref 26.0–34.0)
MCHC: 34.4 g/dL (ref 30.0–36.0)
MCV: 89.4 fL (ref 80.0–100.0)
Platelets: 268 10*3/uL (ref 150–400)
RBC: 3.87 MIL/uL — ABNORMAL LOW (ref 4.22–5.81)
RDW: 15.7 % — ABNORMAL HIGH (ref 11.5–15.5)
WBC: 13.5 10*3/uL — ABNORMAL HIGH (ref 4.0–10.5)
nRBC: 0 % (ref 0.0–0.2)

## 2020-05-06 LAB — SARS CORONAVIRUS 2 BY RT PCR (HOSPITAL ORDER, PERFORMED IN ~~LOC~~ HOSPITAL LAB): SARS Coronavirus 2: NEGATIVE

## 2020-05-06 LAB — LACTIC ACID, PLASMA: Lactic Acid, Venous: 1.1 mmol/L (ref 0.5–1.9)

## 2020-05-06 LAB — TROPONIN I (HIGH SENSITIVITY)
Troponin I (High Sensitivity): 204 ng/L (ref <18)
Troponin I (High Sensitivity): 222 ng/L (ref <18)

## 2020-05-06 LAB — BRAIN NATRIURETIC PEPTIDE: B Natriuretic Peptide: 232.1 pg/mL — ABNORMAL HIGH (ref 0.0–100.0)

## 2020-05-06 MED ORDER — LORATADINE 10 MG PO TABS
10.0000 mg | ORAL_TABLET | Freq: Every day | ORAL | Status: DC
  Administered 2020-05-08 – 2020-05-13 (×5): 10 mg via ORAL
  Filled 2020-05-06 (×6): qty 1

## 2020-05-06 MED ORDER — ALFUZOSIN HCL ER 10 MG PO TB24
10.0000 mg | ORAL_TABLET | Freq: Every day | ORAL | Status: DC
  Filled 2020-05-06: qty 1

## 2020-05-06 MED ORDER — DONEPEZIL HCL 5 MG PO TABS: 5.0000 mg | ORAL_TABLET | Freq: Every day | ORAL | Status: DC

## 2020-05-06 MED ORDER — ENOXAPARIN SODIUM 30 MG/0.3ML ~~LOC~~ SOLN
30.0000 mg | SUBCUTANEOUS | Status: DC
  Administered 2020-05-06: 30 mg via SUBCUTANEOUS
  Filled 2020-05-06 (×2): qty 0.3

## 2020-05-06 MED ORDER — DULOXETINE HCL 30 MG PO CPEP
60.0000 mg | ORAL_CAPSULE | Freq: Every day | ORAL | Status: DC
  Administered 2020-05-08 – 2020-05-13 (×5): 60 mg via ORAL
  Filled 2020-05-06 (×6): qty 2

## 2020-05-06 MED ORDER — SODIUM CHLORIDE 0.9 % IV SOLN
500.0000 mg | INTRAVENOUS | Status: DC
  Administered 2020-05-07 – 2020-05-08 (×3): 500 mg via INTRAVENOUS
  Filled 2020-05-06 (×4): qty 500

## 2020-05-06 MED ORDER — VANCOMYCIN HCL IN DEXTROSE 1-5 GM/200ML-% IV SOLN
1000.0000 mg | Freq: Once | INTRAVENOUS | Status: AC
  Administered 2020-05-06: 1000 mg via INTRAVENOUS
  Filled 2020-05-06: qty 200

## 2020-05-06 MED ORDER — ADULT MULTIVITAMIN W/MINERALS CH
1.0000 | ORAL_TABLET | Freq: Every day | ORAL | Status: DC
  Administered 2020-05-06: 1 via ORAL
  Filled 2020-05-06: qty 1

## 2020-05-06 MED ORDER — HYDROCODONE-ACETAMINOPHEN 5-325 MG PO TABS
1.0000 | ORAL_TABLET | Freq: Four times a day (QID) | ORAL | Status: DC | PRN
  Administered 2020-05-11: 1 via ORAL
  Filled 2020-05-06: qty 1

## 2020-05-06 MED ORDER — DONEPEZIL HCL 5 MG PO TABS
5.0000 mg | ORAL_TABLET | Freq: Every day | ORAL | Status: DC
  Administered 2020-05-08 – 2020-05-13 (×5): 5 mg via ORAL
  Filled 2020-05-06 (×6): qty 1

## 2020-05-06 MED ORDER — METOPROLOL TARTRATE 25 MG PO TABS
25.0000 mg | ORAL_TABLET | Freq: Two times a day (BID) | ORAL | Status: DC
  Administered 2020-05-06 – 2020-05-13 (×9): 25 mg via ORAL
  Filled 2020-05-06 (×12): qty 1

## 2020-05-06 MED ORDER — ACETAMINOPHEN 325 MG PO TABS: 650.0000 mg | ORAL_TABLET | Freq: Three times a day (TID) | ORAL | Status: DC

## 2020-05-06 MED ORDER — PIPERACILLIN-TAZOBACTAM 3.375 G IVPB 30 MIN
3.3750 g | Freq: Once | INTRAVENOUS | Status: AC
  Administered 2020-05-06: 3.375 g via INTRAVENOUS
  Filled 2020-05-06: qty 50

## 2020-05-06 MED ORDER — SODIUM CHLORIDE 0.9 % IV SOLN
2.0000 g | INTRAVENOUS | Status: DC
  Administered 2020-05-06 – 2020-05-09 (×4): 2 g via INTRAVENOUS
  Filled 2020-05-06: qty 20
  Filled 2020-05-06 (×3): qty 2

## 2020-05-06 MED ORDER — ATORVASTATIN CALCIUM 20 MG PO TABS: 20.0000 mg | ORAL_TABLET | Freq: Every day | ORAL | Status: DC

## 2020-05-06 MED ORDER — METOPROLOL TARTRATE 5 MG/5ML IV SOLN
2.5000 mg | INTRAVENOUS | Status: AC | PRN
  Administered 2020-05-07 – 2020-05-08 (×4): 2.5 mg via INTRAVENOUS
  Filled 2020-05-06 (×4): qty 5

## 2020-05-06 MED ORDER — IOHEXOL 300 MG/ML  SOLN
60.0000 mL | Freq: Once | INTRAMUSCULAR | Status: AC | PRN
  Administered 2020-05-06: 60 mL via INTRAVENOUS

## 2020-05-06 MED ORDER — ENOXAPARIN SODIUM 40 MG/0.4ML ~~LOC~~ SOLN: 40.0000 mg | SUBCUTANEOUS | Status: DC

## 2020-05-06 MED ORDER — ACETAMINOPHEN 325 MG PO TABS: 650.0000 mg | ORAL_TABLET | Freq: Three times a day (TID) | ORAL | Status: DC | PRN

## 2020-05-06 MED ORDER — CARBIDOPA-LEVODOPA 25-100 MG PO TABS
1.0000 | ORAL_TABLET | Freq: Three times a day (TID) | ORAL | Status: DC
  Administered 2020-05-06 – 2020-05-13 (×14): 1 via ORAL
  Filled 2020-05-06 (×23): qty 1

## 2020-05-06 NOTE — Progress Notes (Signed)
PHARMACIST - PHYSICIAN COMMUNICATION  CONCERNING:  Enoxaparin (Lovenox) for DVT Prophylaxis    RECOMMENDATION: Patient was prescribed enoxaprin 40mg  q24 hours for VTE prophylaxis.   Filed Weights   05/06/20 1620  Weight: 90.7 kg (200 lb)    Body mass index is 31.32 kg/m.  Estimated Creatinine Clearance: 28.2 mL/min (A) (by C-G formula based on SCr of 1.87 mg/dL (H)).  Patient is candidate for enoxaparin 30mg  every 24 hours based on CrCl <27ml/min   DESCRIPTION: Pharmacy has adjusted enoxaparin dose per [facility]/ policy.  Patient is now receiving enoxaparin 30mg  every 24 hours.  Lu Duffel, PharmD, BCPS Clinical Pharmacist 05/06/2020 8:55 PM

## 2020-05-06 NOTE — ED Notes (Signed)
This RN to the room to collect blood cultures. Pt has removed IV that this RN started.

## 2020-05-06 NOTE — ED Notes (Signed)
Pt removed second IV, secured IVs with gauze.

## 2020-05-06 NOTE — ED Provider Notes (Signed)
ER Provider Note       Time seen: 4:18 PM    I have reviewed the vital signs and the nursing notes.  HISTORY   Chief Complaint No chief complaint on file.   HPI Mike Smith is a 86 y.o. male with a history of atypical chest pain, GERD, palpitations, kidney stones, osteoarthritis who presents today for shortness of breath.  He was reportedly 86% on room air and sent here from [facility] for further evaluation.  Family states he has had a cough but not productive of sputum.  Has had some decreased appetite and weakness but no other complaints.  No past medical history on file.  Allergies Patient has no known allergies.  Review of Systems Constitutional: Negative for fever. Cardiovascular: Negative for chest pain. Respiratory: Positive for shortness of breath and cough Gastrointestinal: Negative for abdominal pain, vomiting and diarrhea. Musculoskeletal: Negative for back pain. Skin: Negative for rash. Neurological: Negative for headaches, focal weakness or numbness.  All systems negative/normal/unremarkable except as stated in the HPI  ____________________________________________   PHYSICAL EXAM:  VITAL SIGNS: Vitals:   05/06/20 1613  BP: 116/76  Pulse: 89  Resp: 18  Temp: 99.2 F (37.3 C)  SpO2: 90%    Constitutional: Alert and oriented.  Mild distress Eyes: Conjunctivae are normal. Normal extraocular movements. ENT      Head: Normocephalic and atraumatic.      Nose: No congestion/rhinnorhea.      Mouth/Throat: Mucous membranes are moist.      Neck: No stridor. Cardiovascular: Normal rate, regular rhythm. No murmurs, rubs, or gallops. Respiratory: Tachypnea with mostly clear breath sounds Gastrointestinal: Soft and nontender. Normal bowel sounds Musculoskeletal: Nontender with normal range of motion in extremities. No lower extremity tenderness nor edema. Neurologic:  Normal speech and language. No gross focal neurologic deficits are  appreciated.  Skin:  Skin is warm, dry and intact.  Pallor is noted Psychiatric: Speech and behavior are normal.  ____________________________________________  EKG: Interpreted by me.  Atrial fibrillation with a rate of 115 bpm, PVC, low voltage, normal QT  ____________________________________________   LABS (pertinent positives/negatives)  Labs Reviewed  CBC - Abnormal; Notable for the following components:      Result Value   WBC 13.5 (*)    RBC 3.87 (*)    Hemoglobin 11.9 (*)    HCT 34.6 (*)    RDW 15.7 (*)    All other components within normal limits  COMPREHENSIVE METABOLIC PANEL - Abnormal; Notable for the following components:   Glucose, Bld 102 (*)    BUN 31 (*)    Creatinine, Ser 1.87 (*)    Total Protein 6.3 (*)    Albumin 2.9 (*)    GFR calc non Af Amer 31 (*)    GFR calc Af Amer 36 (*)    All other components within normal limits  BRAIN NATRIURETIC PEPTIDE - Abnormal; Notable for the following components:   B Natriuretic Peptide 232.1 (*)    All other components within normal limits  TROPONIN I (HIGH SENSITIVITY) - Abnormal; Notable for the following components:   Troponin I (High Sensitivity) 204 (*)    All other components within normal limits  SARS CORONAVIRUS 2 BY RT PCR (HOSPITAL ORDER, [facility])  CULTURE, BLOOD (ROUTINE X 2)  CULTURE, BLOOD (ROUTINE X 2)  LACTIC ACID, PLASMA  TROPONIN I (HIGH SENSITIVITY)    RADIOLOGY  Images were viewed by me Chest x-ray/CT chest IMPRESSION: Multifocal pulmonary  infiltrates most in keeping with multifocal pneumonia in the acute setting. Large left and small right pleural effusions are noted. Left pleural effusion is indeterminate and may represent either a parapneumonic or malignant effusion.  Focal mass within the left upper lobe with obliteration of the a bronchi and postobstructive collapse of the more peripheral left upper lobe, suspicious for a primary bronchogenic  neoplasm. Suspected pathologic bilateral supraclavicular, mediastinal, and bilateral hilar adenopathy. Suspected hepatic metastases. Indeterminate adrenal nodules bilaterally. Repeat staging CT or PET-CT imaging could be performed once the patient's acute issues have resolved.  Emphysema.  Aortic Atherosclerosis (ICD10-I70.0) and Emphysema (ICD10-J43.9).   DIFFERENTIAL DIAGNOSIS  Sepsis, pneumonia, COVID-19, dehydration, electrolyte abnormality  ASSESSMENT AND PLAN  Dyspnea, hypoxia, multifocal pneumonia, pleural effusion, left upper lobe mass   Plan: The patient had presented for dyspnea and hypoxia. Patient's labs did reveal leukocytosis and also chronic kidney disease.  Troponin is elevated which is likely demand related, there could be a component of congestive heart failure as well.  I have ordered broad-spectrum antibiotics to cover for his multifocal pneumonia.  He does have evidence of a left upper lung mass which will need to be evaluated by oncology.  I have discussed with cardiology who recommends trending his troponins but not treating with heparin.  He remains on nasal cannula oxygen.  I will discuss with the hospitalist for admission.  Lenise Arena MD    Note: This note was generated in part or whole with voice recognition software. Voice recognition is usually quite accurate but there are transcription errors that can and very often do occur. I apologize for any typographical errors that were not detected and corrected.     Earleen Newport, MD 05/06/20 Pauline Aus

## 2020-05-06 NOTE — ED Notes (Signed)
Pt attempting to get out of bed. This RN and Claiborne Billings, RN assisted back to bed. Pt does have a hx of dementia. Bed alarm placed on pt.

## 2020-05-06 NOTE — ED Triage Notes (Signed)
Pt over from [facility] for increased SOB. Pt son reports that the pt has become more SOB and has had longer periods of apnea between breathing.

## 2020-05-06 NOTE — ED Notes (Signed)
Pt placed on 3L Mike Smith pt O2 level 87% on 2L [place]. Pt does not normally wear O2 at home. Dr. Jimmye Norman, MD made aware.

## 2020-05-06 NOTE — H&P (Addendum)
History and Physical    Baby Stairs ONG:[id number] DOB: Mar 01, 1928 DOA: 05/06/2020  PCP: Madelyn Brunner, MD   Patient coming from: Home  I have personally briefly reviewed patient's old medical records in [place]  Chief Complaint: Shortness of breath  HPI: Mike Smith is a 86 y.o. male with medical history significant for HTN, ILD not on oxygen, Parkinson's disease, dementia, CKD 3, who presents to the emergency room with a several day history of shortness of breath, seen by his PCP at [facility] clinic, where his O2 sat was 86% on room air.  Most of history obtained from son at bedside who states that he had a nonproductive cough but without fever or chills.  Has not received Covid vaccine.   Has decreased appetite and weakness.  No complaints of chest pain.  No reports of nausea vomiting or change in bowel habits or abdominal pain ED Course: On arrival, he was noted to be in A. fib with a rate of 115.  BP 143/80, O2 sat 90% on room air improving to the mid 90s on 2 L.  Blood work with WBC 13,000, hemoglobin 11.9.  Troponin 204>> 222, BNP 232.  Creatinine 1.87 which is about his baseline around 1.71.  CT chest showed multifocal pneumonia, large left and small right pleural effusions, left upper lobe focal mass with postobstructive collapse, mediastinal and bilateral hilar adenopathy and suspected hepatic metastases.  The emergency room provider spoke with cardiologist, Dr. Ubaldo Glassing regarding elevated troponin and he recommended against heparin infusion at this time.  Patient started on systemic antibiotics.  Hospitalist consulted for admission.  Review of Systems: Unable to obtain due to dementia  Past Medical History:  Diagnosis Date  . Atrial fibrillation ([place])   . Dementia [facility])     Past Surgical History:  Procedure Laterality Date  . SHOULDER SURGERY       reports that he has quit smoking. He has never used smokeless tobacco. He reports previous  alcohol use. He reports that he does not use drugs.  No Known Allergies  History reviewed. No pertinent family history.    Prior to Admission medications   Medication Sig Start Date End Date Taking? Authorizing Provider  acetaminophen (TYLENOL) 650 MG CR tablet Take 1,300 mg by mouth every 12 (twelve) hours as needed for Pain   Yes [provider]  alfuzosin (UROXATRAL) 10 MG 24 hr tablet Take 10 mg by mouth daily. 04/19/20  Yes [provider]  atorvastatin (LIPITOR) 20 MG tablet Take 20 mg by mouth daily. 03/05/20  Yes [provider]  carbidopa-levodopa (SINEMET IR) 25-100 MG tablet Take 1 tablet by mouth 3 (three) times daily. 04/13/20  Yes [provider]  cetirizine (ZYRTEC) 10 MG tablet Take 1 tablet by mouth daily. 04/17/20  Yes [provider]  donepezil (ARICEPT) 5 MG tablet Take 5 mg by mouth daily. 04/28/20  Yes [provider]  DULoxetine (CYMBALTA) 30 MG capsule TAKE 2 CAPSULES (60 MG TOTAL) BY MOUTH ONCE DAILY 03/30/20  Yes [provider]  HYDROcodone-acetaminophen (NORCO/VICODIN) 5-325 MG tablet Take by mouth. 12/13/15  Yes [provider]  Multiple Vitamin (MULTI-VITAMIN) tablet Take 1 tablet by mouth daily.   Yes [provider]    Physical Exam: Vitals:   05/06/20 1613 05/06/20 1620 05/06/20 1645  BP: 116/76  114/75  Pulse: 89  (!) 107  Resp: 18  20  Temp: 99.2 F (37.3 C)    TempSrc: Oral  SpO2: 90%  90%  Weight:  90.7 kg   Height:  5\' 7"  (1.702 m)      Vitals:   05/06/20 1613 05/06/20 1620 05/06/20 1645  BP: 116/76  114/75  Pulse: 89  (!) 107  Resp: 18  20  Temp: 99.2 F (37.3 C)    TempSrc: Oral    SpO2: 90%  90%  Weight:  90.7 kg   Height:  5\' 7"  (1.702 m)       Constitutional: Alert but restless and disoriented.  Not answering questions.  Removing nasal cannula from his face HEENT:      Head: Normocephalic and atraumatic.         Eyes: PERLA, EOMI, Conjunctivae are  normal. Sclera is non-icteric.       Mouth/Throat: Mucous membranes are moist.       Neck: Supple with no signs of meningismus. Cardiovascular:  Irregularly irregular and tachycardic. No murmurs, gallops, or rubs. 2+ symmetrical distal pulses are present . No JVD. No LE edema Respiratory: Respiratory effort increased, tachypneic.Lungs sounds diminished bilaterally.  Scattered crackles Gastrointestinal: Soft, non tender, and non distended with positive bowel sounds. No rebound or guarding. Genitourinary: No CVA tenderness. Musculoskeletal: Nontender with normal range of motion in all extremities. No cyanosis, or erythema of extremities. Neurologic:  . Face is symmetric. Moving all extremities. No gross focal neurologic deficits . Skin: Skin is warm, dry.  No rash or ulcers Psychiatric: Restless.  Not agitated  Labs on Admission: I have personally reviewed following labs and imaging studies  CBC: Recent Labs  Lab 05/06/20 1638  WBC 13.5*  HGB 11.9*  HCT 34.6*  MCV 89.4  PLT 324   Basic Metabolic Panel: Recent Labs  Lab 05/06/20 1638  NA 138  K 4.5  CL 104  CO2 22  GLUCOSE 102*  BUN 31*  CREATININE 1.87*  CALCIUM 9.2   GFR: Estimated Creatinine Clearance: 28.2 mL/min (A) (by C-G formula based on SCr of 1.87 mg/dL (H)). Liver Function Tests: Recent Labs  Lab 05/06/20 1638  AST 31  ALT 6  ALKPHOS 78  BILITOT 1.1  PROT 6.3*  ALBUMIN 2.9*   No results for input(s): LIPASE, AMYLASE in the last 168 hours. No results for input(s): AMMONIA in the last 168 hours. Coagulation Profile: No results for input(s): INR, PROTIME in the last 168 hours. Cardiac Enzymes: No results for input(s): CKTOTAL, CKMB, CKMBINDEX, TROPONINI in the last 168 hours. BNP (last 3 results) No results for input(s): PROBNP in the last 8760 hours. HbA1C: No results for input(s): HGBA1C in the last 72 hours. CBG: No results for input(s): GLUCAP in the last 168 hours. Lipid Profile: No results for  input(s): CHOL, HDL, LDLCALC, TRIG, CHOLHDL, LDLDIRECT in the last 72 hours. Thyroid Function Tests: No results for input(s): TSH, T4TOTAL, FREET4, T3FREE, THYROIDAB in the last 72 hours. Anemia Panel: No results for input(s): VITAMINB12, FOLATE, FERRITIN, TIBC, IRON, RETICCTPCT in the last 72 hours. Urine analysis:    Component Value Date/Time   COLORURINE Yellow 08/23/2014 2013   APPEARANCEUR Clear 08/23/2014 2013   LABSPEC 1.019 08/23/2014 2013   PHURINE 6.0 08/23/2014 2013   GLUCOSEU Negative 08/23/2014 2013   HGBUR Negative 08/23/2014 2013   BILIRUBINUR Negative 08/23/2014 2013   KETONESUR Negative 08/23/2014 2013   PROTEINUR Negative 08/23/2014 2013   NITRITE Negative 08/23/2014 2013   LEUKOCYTESUR Negative 08/23/2014 2013    Radiological Exams on Admission: DG Chest 1 View  Result Date: 05/06/2020 CLINICAL DATA:  86 year old male with history of shortness of breath and dyspnea. EXAM: CHEST  1 VIEW COMPARISON:  Chest x-ray 08/23/2014. FINDINGS: Multifocal airspace consolidation and interstitial prominence scattered asymmetrically throughout the lungs bilaterally, most confluent in the left upper lobe medially. Small left pleural effusion. No definite pneumothorax. Pulmonary vasculature is largely obscured. Heart size is normal. IMPRESSION: 1. Findings are concerning for severe multilobar bilateral pneumonia (left greater than right). The possibility of a centrally obstructing mass in the left upper lobe should be considered. Further evaluation with contrast enhanced chest CT could better evaluate these findings if clinically appropriate. Electronically Signed   By: Vinnie Langton M.D.   On: 05/06/2020 17:03   CT Chest W Contrast  Result Date: 05/06/2020 CLINICAL DATA:  Chest pain, dyspnea, episodic apnea EXAM: CT CHEST WITH CONTRAST TECHNIQUE: Multidetector CT imaging of the chest was performed during intravenous contrast administration. CONTRAST:  59mL OMNIPAQUE IOHEXOL 300 MG/ML   SOLN COMPARISON:  None. FINDINGS: Cardiovascular: Global cardiac size within normal limits. Moderate calcification of the aortic valve leaflets. Extensive coronary artery calcification largely within the right coronary artery. No pericardial effusions. Moderate atherosclerotic calcification within the thoracic aorta without evidence of aneurysm. Central pulmonary arteries are of normal caliber. Mediastinum/Nodes: There is extensive pathologic hilar and mediastinal adenopathy. Probable pathologic bilateral supraclavicular adenopathy. Index subcarinal lymph node measures 3.0 x 4.1 cm at axial image # 72/2 Lungs/Pleura: Moderate centrilobular emphysema. There is, superimposed, extensive multifocal airspace infiltrate within the left upper lobe, primarily, but scattered within the right lower lobe and right middle lobe in keeping with changes of multifocal pneumonia. However, there is, superimposed, a suspected mass within the left upper lobe more peripherally, poorly visualized due to adjacent infiltrate and peripherally collapsed left upper lobe, but roughly measuring 3.6 x 3.7 x 5.1 cm on coronal image # 51/5 and sagittal image # 121/6. There is an associated large left pleural effusion and it is unclear whether this represents a parapneumonic effusion or malignant effusion. Small right pleural effusion. There are innumerable scattered pulmonary nodules predominantly within the lung bases which are indeterminate, but are suspicious for intrapulmonary metastases. Upper Abdomen: Multiple nodules are seen within the a drawing glands bilaterally which are not well characterized on this examination. There are multiple low-attenuation lesions seen within the liver suspicious for hepatic metastases given the additional findings. Musculoskeletal: Remote compression fracture of T11. No acute bone abnormality. No lytic or blastic bone lesion identified. IMPRESSION: Multifocal pulmonary infiltrates most in keeping with  multifocal pneumonia in the acute setting. Large left and small right pleural effusions are noted. Left pleural effusion is indeterminate and may represent either a parapneumonic or malignant effusion. Focal mass within the left upper lobe with obliteration of the a bronchi and postobstructive collapse of the more peripheral left upper lobe, suspicious for a primary bronchogenic neoplasm. Suspected pathologic bilateral supraclavicular, mediastinal, and bilateral hilar adenopathy. Suspected hepatic metastases. Indeterminate adrenal nodules bilaterally. Repeat staging CT or PET-CT imaging could be performed once the patient's acute issues have resolved. Emphysema. Aortic Atherosclerosis (ICD10-I70.0) and Emphysema (ICD10-J43.9). Electronically Signed   By: Fidela Salisbury MD   On: 05/06/2020 18:46    EKG: Independently reviewed. Interpretation : Atrial fibrillation with rate of 115  Assessment/Plan 86 year old male with medical history significant for HTN, ILD not on oxygen, Parkinson's disease, dementia, CKD 3, who presents to the emergency room with a several day history of shortness of breath, seen by his PCP at [facility] clinic, where his O2 sat was 86% on room  air.  Work-up showing multifocal pneumonia, A. fib with RVR, elevated troponin in the 200s, LUL mass with evidence of metastases.     Acute respiratory failure with hypoxia (HCC) -O2 sat 86% on room air in doctor's office and patient presenting with several day history of shortness of breath and cough -Etiology multifactorial and related to multifocal pneumonia, likely metastatic bronchogenic carcinoma, pleural effusions as seen on CT chest, as well as background history of ILD, from review of care everywhere -Supplemental oxygen as needed to keep sats over 92% -Treat each etiology    Multifocal pneumonia -IV Rocephin and azithromycin -Supplemental oxygen, antitussives, supportive care -Follow-up blood cultures  --Covid negative. Was not  vaccinated, per son    Mass of upper lobe of left lung   Pleural effusion, suspect malignant   Hepatic metastases on CT [facility]) -Radiologist recommending further evaluation with PET scan -Oncology/pulmonology consult can be considered in the a.m.  Elevated troponin   New onset atrial fibrillation with RVR (HCC) -Troponin 204>>222, suspect demand ischemia, related to new onset rapid A. fib -Emergency room provider spoke with Dr. Ubaldo Glassing who recommended against heparin infusion at this time -Prophylactic Lovenox only for now -Patient denies chest pain and no acute ST-T wave changes on EKG -IV metoprolol as needed to keep rate below 115 -For further discussion on systemic anticoagulation for stroke prevention and risk benefit in view of multiple acute/chronic problems outlined above    Benign essential hypertension -Continue home meds    CRF (chronic renal failure), stage 3 (moderate) -Renal function at baseline    ILD (interstitial lung disease) ([place]) -Likely stable and not acutely flared.  No history of home O2 use    Parkinson disease (HCC) Dementia -Continue Sinemet, Aricept    DVT prophylaxis: Lovenox  Code Status: full code  Family Communication: Son at bedside Disposition Plan: Back to previous home environment Consults called: none  Status:At the time of admission, it appears that the appropriate admission status for this patient is INPATIENT. This is judged to be reasonable and necessary in order to provide the required intensity of service to ensure the patient's safety given the presenting symptoms, physical exam findings, and initial radiographic and laboratory data in the context of their  Comorbid conditions.   Patient requires inpatient status due to high intensity of service, high risk for further deterioration and high frequency of surveillance required.   I certify that at the point of admission it is my clinical judgment that the patient will require inpatient  hospital care spanning beyond [place] MD [facility]     05/06/2020, 8:03 PM

## 2020-05-06 NOTE — ED Notes (Signed)
Spoke with RT about pt's O2 saturation. Pt is mouth breathing and unable to keep the O2 [place] in his mouth. RT suggested to increase pt's O2 flow rate to 6L [place]. Pt O2 sat 94% at this time

## 2020-05-07 ENCOUNTER — Inpatient Hospital Stay: Payer: Medicare Other

## 2020-05-07 DIAGNOSIS — L8990 Pressure ulcer of unspecified site, unspecified stage: Secondary | ICD-10-CM | POA: Insufficient documentation

## 2020-05-07 DIAGNOSIS — J9601 Acute respiratory failure with hypoxia: Secondary | ICD-10-CM

## 2020-05-07 DIAGNOSIS — Z515 Encounter for palliative care: Secondary | ICD-10-CM

## 2020-05-07 LAB — LACTATE DEHYDROGENASE: LDH: 317 U/L — ABNORMAL HIGH (ref 98–192)

## 2020-05-07 LAB — BLOOD GAS, ARTERIAL
Acid-base deficit: 1.8 mmol/L (ref 0.0–2.0)
Bicarbonate: 21.9 mmol/L (ref 20.0–28.0)
O2 Saturation: 96.1 %
Patient temperature: 37
pCO2 arterial: 33 mmHg (ref 32.0–48.0)
pH, Arterial: 7.43 (ref 7.350–7.450)
pO2, Arterial: 80 mmHg — ABNORMAL LOW (ref 83.0–108.0)

## 2020-05-07 MED ORDER — IPRATROPIUM-ALBUTEROL 0.5-2.5 (3) MG/3ML IN SOLN
3.0000 mL | Freq: Four times a day (QID) | RESPIRATORY_TRACT | Status: DC | PRN
  Filled 2020-05-07: qty 3

## 2020-05-07 MED ORDER — LORAZEPAM 2 MG/ML IJ SOLN
1.0000 mg | Freq: Once | INTRAMUSCULAR | Status: AC
  Administered 2020-05-07: 1 mg via INTRAVENOUS
  Filled 2020-05-07: qty 1

## 2020-05-07 MED ORDER — HALOPERIDOL LACTATE 5 MG/ML IJ SOLN
2.0000 mg | Freq: Four times a day (QID) | INTRAMUSCULAR | Status: DC | PRN
  Administered 2020-05-07 – 2020-05-09 (×2): 2 mg via INTRAVENOUS
  Filled 2020-05-07 (×2): qty 1

## 2020-05-07 NOTE — Progress Notes (Signed)
Interventional Radiology Brief Note:  Order for thoracentesis was placed earlier today after CXR and CT imaging showed: -Multifocal pulmonary infiltrates most in keeping with multifocal pneumonia in the acute setting. Large left and small right pleural effusions are noted. Left pleural effusion is indeterminate and may represent either a parapneumonic or malignant effusion. -Focal mass within the left upper lobe with obliteration of the a bronchi and postobstructive collapse of the more peripheral left upper lobe, suspicious for a primary bronchogenic neoplasm. Suspected pathologic bilateral supraclavicular, mediastinal, and bilateral hilar adenopathy. Suspected hepatic metastases. Indeterminate adrenal nodules bilaterally.  Over the course of the day, several consultants have been able to assess patient and discuss his current status with his daughter including hospice and palliative care as well as Oncology.   Radiology sent for patient this afternoon, however hospice and palliative care were again meeting with family to discuss situation and plan moving forward, therefore procedure put on hold.  In addition, from Dr. Aletha Halim note this afternoon there is question on whether he is a candidate for thoracentesis based on medical stability.   At this time, will defer thoracentesis to allow for more cohesive care plan.  Although it is possible that thoracentesis may improve WOB, the extent of lung disease and insult may limit the impact of a therapeutic procedure.   Update at 4:48:  ED staff brought patient to radiology after completion of Hospice and Palliative care visit this afternoon.  All of the above was discussed with the daughter and plan remains to hold on thoracentesis this afternoon.  Daughter is aware of all risks and benefits associated with proceeding with thoracentesis.  She indicates that she would move forward with thoracentesis if it is in the best interest of the patient.     Patient returned to his room.  No procedure performed today. Radiology remains available to serve this patient if warranted.   Brynda Greathouse, MS RD PA-C

## 2020-05-07 NOTE — Progress Notes (Signed)
Chaplain services offered to family for spiritual support.  Melissa, chaplain at bedside.

## 2020-05-07 NOTE — Progress Notes (Signed)
PROGRESS NOTE    Mike Smith  WNI:[id number] DOB: [birth year] DOA: 05/06/2020 PCP: Madelyn Brunner, MD    Brief Narrative:  Mike Smith is a 86 y.o. male with medical history significant for HTN, ILD not on oxygen, Parkinson's disease, dementia, CKD 3, who presents to the emergency room with a several day history of shortness of breath, seen by his PCP at [facility] clinic, where his O2 sat was 86% on room air.  Most of history obtained from son at bedside who states that he had a nonproductive cough but without fever or chills.  Has not received Covid vaccine.   Has decreased appetite and weakness.  No complaints of chest pain.  No reports of nausea vomiting or change in bowel habits or abdominal pain  On arrival, he was noted to be in A. fib with a rate of 115.  BP 143/80, O2 sat 90% on room air improving to the mid 90s on 2 L.  Blood work with WBC 13,000, hemoglobin 11.9.  Troponin 204>> 222, BNP 232.  Creatinine 1.87 which is about his baseline around 1.71.  CT chest showed multifocal pneumonia, large left and small right pleural effusions, left upper lobe focal mass with postobstructive collapse, mediastinal and bilateral hilar adenopathy and suspected hepatic metastases.  The emergency room provider spoke with cardiologist, Dr. Ubaldo Glassing regarding elevated troponin and he recommended against heparin infusion at this time.  Patient started on systemic antibiotics.  Hospitalist consulted for admission.   Assessment & Plan:   Principal Problem:   Acute respiratory failure with hypoxia (HCC) Active Problems:   Multifocal pneumonia   Mass of upper lobe of left lung   Elevated troponin   Benign essential hypertension   CRF (chronic renal failure), stage 3 (moderate)   ILD (interstitial lung disease) (HCC)   Parkinson disease (HCC)   Pleural effusion   Hepatic metastases on CT ([place])   New onset atrial fibrillation (HCC)   Acute hypoxic respiratory failure, present  on admission Acute metabolic encephalopathy, present on admission Hx interstitial lung disease Patient presenting from PCP office with hypoxia with SPO2 86% on room air.  Not on oxygen outpatient.  Etiology likely multifactorial secondary to multifocal pneumonia, likely metastatic bronchogenic carcinoma, bilateral pleural effusions greater on left and underlying history of ILD. --Continue treatment as below --Continue supplemental oxygen, maintain SPO2 greater than 88%  Multifocal pneumonia Office with hypoxia with SPO2 86% on room air.  Chest x-ray notable for multifocal airspace consolidation bilaterally.  Chest notable for multifocal pneumonia large left pleural effusion, small right pleural effusion and a mass left upper lobe.  Covid-19 PCR negative. --Continue antibiotics with azithromycin and ceftriaxone --Supplemental oxygen to maintain SPO2 greater than 88% --Supportive care  Bilateral pleural effusion, left greater than right CT notable for a large left pleural effusion.  Suspect malignant.  Discussed with patient's daughter, patient wanted all aggressive measures and okay for proceeding with thoracentesis. --IR consulted for thoracentesis --Will assess Lights criteria to ascertain exudative versus transudate of effusion --Also obtain cytology to assist with diagnosis --Continue supplemental oxygen as above  Left upper lobe lung mass with likely liver metastasis CT chest notable for 0.6 x 3.7 x 5.1 cm left upper lobe mass causing peripheral left upper lobe collapse with associated oral supraclavicular, mediastinal and bilateral hilar adenopathy and suspected hepatic metastases.  Discussion with daughter, patient requested all aggressive measures and they would like to proceed with further invasive testing.  Discussed with daughter and daughter-in-law extensively  regarding grim prognosis, especially given his poor functional status and underlying Parkinson's dementia. --Oncology consult  pending --IR thoracentesis as above  Paroxysmal atrial fibrillation with RVR Etiology likely secondary to increased metabolic demand with multifocal pneumonia and probable metastatic bronchogenic carcinoma. --Continue rate control with metoprolol tartrate 25 mg p.o. twice daily --Metoprolol tartrate 5 mg IV q3h prn for sustained heart rate greater than 120 for 5 minutes --Not a candidate for chronic anticoagulation secondary to comorbidities and fall/bleeding risk --Continue to monitor on telemetry  Elevated troponin Etiology likely secondary to demand ischemia in the setting of A. fib with RVR.  Seen by cardiology.  Not a candidate for heparin or invasive or noninvasive ischemic work-up given his comorbidities. --Troponin 204>222 --Continue metoprolol tartrate 25 mg p.o. twice daily  Parkinson's dementia --Continue Sinemet, Aricept when mental status improves  CKD stage III Baseline creatinine appears to be 1.71, last noted in November 2015.  Creatinine on admission 1.87. --Avoid nephrotoxins, renally dose all medications. --Monitor renal function closely daily    DVT prophylaxis: SCDs, holding chemical DVT prophylaxis for planned procedure Code Status: Full code -verified once again with patient's daughter today Family Communication: Updated patient's daughter and daughter-in-law extensively via telephone this morning  Disposition Plan:  Status is: Inpatient  Remains inpatient appropriate because:Hemodynamically unstable, Altered mental status, Ongoing diagnostic testing needed not appropriate for outpatient work up, Unsafe d/c plan and IV treatments appropriate due to intensity of illness or inability to take PO   Dispo: The patient is from: Home              Anticipated d/c is to: To be determined              Anticipated d/c date is: > 3 days              Patient currently is not medically stable to d/c.        Consultants:   Cardiology, Dr. Ubaldo Glassing  Medical  oncology  Interventional radiology  Procedures:   None  Antimicrobials:   Azithromycin 7/25>>  Ceftriaxone 7/25>>   Subjective: Patient seen and examined at bedside in the ED holding area.  Very ill/cachectic in appearance.  Nonresponsive to verbal command.  Will respond to pain.  No family present in room.  Unable to obtain any further ROS due to patient's current mental status.  Called and spoke extensively with patient's daughter-in-law and daughter regarding grim prognosis with new findings of likely metastatic bronchogenic carcinoma.  Daughter states that her father always wished for living as long as he could with any type of aggressive measures as needed.  Okay to proceed with thoracentesis to assist with possible diagnosis and comfort related to his underlying dyspnea.  Nursing concerned about his current mental status and oral medications ordered, instructed her to hold until his mental status improves.  Objective: Vitals:   05/07/20 0510 05/07/20 0530 05/07/20 0557 05/07/20 0610  BP:  125/82    Pulse:  98 101 99  Resp: 20 19 18 17   Temp:      TempSrc:      SpO2:  93% 91% 91%  Weight:      Height:        Intake/Output Summary (Last 24 hours) at 05/07/2020 1032 Last data filed at 05/06/2020 2256 Gross per 24 hour  Intake 350 ml  Output --  Net 350 ml   Filed Weights   05/06/20 1620  Weight: 90.7 kg    Examination:  General exam: Extremely  ill in appearance, altered, somnolent, responds to painful stimuli Respiratory system: Coarse breath sounds bilaterally with crackles, slightly increased respiratory effort, on 6 L nasal cannula saturating 91%. Cardiovascular system: S1 & S2 heard, irregularly irregular rhythm, tachycardic. No JVD, murmurs, rubs, gallops or clicks. No pedal edema. Gastrointestinal system: Abdomen is nondistended, soft and nontender. No organomegaly or masses felt. Normal bowel sounds heard. Central nervous system: Somnolent, responds to  pain Extremities: Symmetric 5 x 5 power. Skin: No rashes, lesions or ulcers Psychiatry: Altered    Data Reviewed: I have personally reviewed following labs and imaging studies  CBC: Recent Labs  Lab 05/06/20 1638  WBC 13.5*  HGB 11.9*  HCT 34.6*  MCV 89.4  PLT 809   Basic Metabolic Panel: Recent Labs  Lab 05/06/20 1638  NA 138  K 4.5  CL 104  CO2 22  GLUCOSE 102*  BUN 31*  CREATININE 1.87*  CALCIUM 9.2   GFR: Estimated Creatinine Clearance: 28.2 mL/min (A) (by C-G formula based on SCr of 1.87 mg/dL (H)). Liver Function Tests: Recent Labs  Lab 05/06/20 1638  AST 31  ALT 6  ALKPHOS 78  BILITOT 1.1  PROT 6.3*  ALBUMIN 2.9*   No results for input(s): LIPASE, AMYLASE in the last 168 hours. No results for input(s): AMMONIA in the last 168 hours. Coagulation Profile: No results for input(s): INR, PROTIME in the last 168 hours. Cardiac Enzymes: No results for input(s): CKTOTAL, CKMB, CKMBINDEX, TROPONINI in the last 168 hours. BNP (last 3 results) No results for input(s): PROBNP in the last 8760 hours. HbA1C: No results for input(s): HGBA1C in the last 72 hours. CBG: No results for input(s): GLUCAP in the last 168 hours. Lipid Profile: No results for input(s): CHOL, HDL, LDLCALC, TRIG, CHOLHDL, LDLDIRECT in the last 72 hours. Thyroid Function Tests: No results for input(s): TSH, T4TOTAL, FREET4, T3FREE, THYROIDAB in the last 72 hours. Anemia Panel: No results for input(s): VITAMINB12, FOLATE, FERRITIN, TIBC, IRON, RETICCTPCT in the last 72 hours. Sepsis Labs: Recent Labs  Lab 05/06/20 1730  LATICACIDVEN 1.1    Recent Results (from the past 240 hour(s))  SARS Coronavirus 2 by RT PCR (hospital order, performed in [facility] hospital lab) Nasopharyngeal Nasopharyngeal Swab     Status: None   Collection Time: 05/06/20  4:38 PM   Specimen: Nasopharyngeal Swab  Result Value Ref Range Status   SARS Coronavirus 2 NEGATIVE NEGATIVE Final    Comment:  (NOTE) SARS-CoV-2 target nucleic acids are NOT DETECTED.  The SARS-CoV-2 RNA is generally detectable in upper and lower respiratory specimens during the acute phase of infection. The lowest concentration of SARS-CoV-2 viral copies this assay can detect is 250 copies / mL. A negative result does not preclude SARS-CoV-2 infection and should not be used as the sole basis for treatment or other patient management decisions.  A negative result may occur with improper specimen collection / handling, submission of specimen other than nasopharyngeal swab, presence of viral mutation(s) within the areas targeted by this assay, and inadequate number of viral copies (<250 copies / mL). A negative result must be combined with clinical observations, patient history, and epidemiological information.  Fact Sheet for Patients:   StrictlyIdeas.no  Fact Sheet for Healthcare Providers: BankingDealers.co.za  This test is not yet approved or  cleared by the [place] FDA and has been authorized for detection and/or diagnosis of SARS-CoV-2 by FDA under an Emergency Use Authorization (EUA).  This EUA will remain in effect (meaning this test can  be used) for the duration of the COVID-19 declaration under Section 564(b)(1) of the Act, 21 U.S.C. section 360bbb-3(b)(1), unless the authorization is terminated or revoked sooner.  Performed at [facility], [address] [place], [place] [address]   Blood culture (routine x 2)     Status: None (Preliminary result)   Collection Time: 05/06/20  5:29 PM   Specimen: BLOOD  Result Value Ref Range Status   Specimen Description BLOOD RIGHT HAND  Final   Special Requests   Final    BOTTLES DRAWN AEROBIC AND ANAEROBIC Blood Culture adequate volume   Culture   Final    NO GROWTH < 24 HOURS Performed at [facility], [address], [place], [place] [address]    Report Status PENDING   Incomplete  Blood culture (routine x 2)     Status: None (Preliminary result)   Collection Time: 05/06/20  5:29 PM   Specimen: BLOOD  Result Value Ref Range Status   Specimen Description BLOOD RIGHT FOREARM  Final   Special Requests   Final    BOTTLES DRAWN AEROBIC AND ANAEROBIC Blood Culture results may not be optimal due to an excessive volume of blood received in culture bottles   Culture   Final    NO GROWTH < 24 HOURS Performed at [facility], [address] [place], [place] [address]    Report Status PENDING  Incomplete         Radiology Studies: DG Chest 1 View  Result Date: 05/06/2020 CLINICAL DATA:  86 year old male with history of shortness of breath and dyspnea. EXAM: CHEST  1 VIEW COMPARISON:  Chest x-ray 08/23/2014. FINDINGS: Multifocal airspace consolidation and interstitial prominence scattered asymmetrically throughout the lungs bilaterally, most confluent in the left upper lobe medially. Small left pleural effusion. No definite pneumothorax. Pulmonary vasculature is largely obscured. Heart size is normal. IMPRESSION: 1. Findings are concerning for severe multilobar bilateral pneumonia (left greater than right). The possibility of a centrally obstructing mass in the left upper lobe should be considered. Further evaluation with contrast enhanced chest CT could better evaluate these findings if clinically appropriate. Electronically Signed   By: Vinnie Langton M.D.   On: 05/06/2020 17:03   CT Chest W Contrast  Result Date: 05/06/2020 CLINICAL DATA:  Chest pain, dyspnea, episodic apnea EXAM: CT CHEST WITH CONTRAST TECHNIQUE: Multidetector CT imaging of the chest was performed during intravenous contrast administration. CONTRAST:  46mL OMNIPAQUE IOHEXOL 300 MG/ML  SOLN COMPARISON:  None. FINDINGS: Cardiovascular: Global cardiac size within normal limits. Moderate calcification of the aortic valve leaflets. Extensive coronary artery calcification largely within the  right coronary artery. No pericardial effusions. Moderate atherosclerotic calcification within the thoracic aorta without evidence of aneurysm. Central pulmonary arteries are of normal caliber. Mediastinum/Nodes: There is extensive pathologic hilar and mediastinal adenopathy. Probable pathologic bilateral supraclavicular adenopathy. Index subcarinal lymph node measures 3.0 x 4.1 cm at axial image # 72/2 Lungs/Pleura: Moderate centrilobular emphysema. There is, superimposed, extensive multifocal airspace infiltrate within the left upper lobe, primarily, but scattered within the right lower lobe and right middle lobe in keeping with changes of multifocal pneumonia. However, there is, superimposed, a suspected mass within the left upper lobe more peripherally, poorly visualized due to adjacent infiltrate and peripherally collapsed left upper lobe, but roughly measuring 3.6 x 3.7 x 5.1 cm on coronal image # 51/5 and sagittal image # 121/6. There is an associated large left pleural effusion and it is unclear whether this represents a parapneumonic  effusion or malignant effusion. Small right pleural effusion. There are innumerable scattered pulmonary nodules predominantly within the lung bases which are indeterminate, but are suspicious for intrapulmonary metastases. Upper Abdomen: Multiple nodules are seen within the a drawing glands bilaterally which are not well characterized on this examination. There are multiple low-attenuation lesions seen within the liver suspicious for hepatic metastases given the additional findings. Musculoskeletal: Remote compression fracture of T11. No acute bone abnormality. No lytic or blastic bone lesion identified. IMPRESSION: Multifocal pulmonary infiltrates most in keeping with multifocal pneumonia in the acute setting. Large left and small right pleural effusions are noted. Left pleural effusion is indeterminate and may represent either a parapneumonic or malignant effusion. Focal mass  within the left upper lobe with obliteration of the a bronchi and postobstructive collapse of the more peripheral left upper lobe, suspicious for a primary bronchogenic neoplasm. Suspected pathologic bilateral supraclavicular, mediastinal, and bilateral hilar adenopathy. Suspected hepatic metastases. Indeterminate adrenal nodules bilaterally. Repeat staging CT or PET-CT imaging could be performed once the patient's acute issues have resolved. Emphysema. Aortic Atherosclerosis (ICD10-I70.0) and Emphysema (ICD10-J43.9). Electronically Signed   By: Fidela Salisbury MD   On: 05/06/2020 18:46        Scheduled Meds: . alfuzosin  10 mg Oral Daily  . atorvastatin  20 mg Oral Daily  . carbidopa-levodopa  1 tablet Oral TID  . donepezil  5 mg Oral Daily  . DULoxetine  60 mg Oral Daily  . enoxaparin (LOVENOX) injection  30 mg Subcutaneous Q24H  . loratadine  10 mg Oral Daily  . metoprolol tartrate  25 mg Oral BID  . multivitamin with minerals  1 tablet Oral Daily   Continuous Infusions: . azithromycin Stopped (05/07/20 0206)  . cefTRIAXone (ROCEPHIN)  IV Stopped (05/06/20 2256)     LOS: 1 day    Time spent: 52 minutes spent on chart review, discussion with nursing staff, consultants, updating family and interview/physical exam; more than 50% of that time was spent in counseling and/or coordination of care.    Tane Biegler J [place] DO [facility] Available via Epic secure chat 7am-7pm After these hours, please refer to coverage provider listed on [email or web address] 05/07/2020, 10:32 AM

## 2020-05-07 NOTE — Consult Note (Signed)
[place]  [phone] [phone]   Name: Mike Smith Date: 05/07/2020 MRN: [id number]  DOB: 06-17-1928  Patient Care Team: Madelyn Brunner, MD as PCP - General (Internal Medicine)    REASON FOR CONSULTATION: Mike Smith is a 86 y.o. male with multiple medical problems including history of interstitial lung disease not on O2, Parkinson's disease, dementia, CKD 3, who was admitted to the hospital 05/07/2020 with hypoxic respiratory failure.  Patient was found to have multifocal pneumonia with an upper lobe lung mass, probable malignant pleural effusion, and hepatic metastasis on CT.  Patient was also found to have new onset A. fib with RVR.  He is not felt to likely be a candidate for aggressive cancer treatment.  Palliative care was consulted to help address goals.  SOCIAL HISTORY:     reports that he has quit smoking. He has never used smokeless tobacco. He reports previous alcohol use. He reports that he does not use drugs.   Patient is widowed x10 years.  He lives at home with his son.  Patient also has a daughter who is involved in his care.  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  He has a bachelor's degree from [employer] and a masters from [employer].  ADVANCE DIRECTIVES:  Not on file  CODE STATUS: Full code  PAST MEDICAL HISTORY: Past Medical History:  Diagnosis Date   Atrial fibrillation ([place])    Dementia ([place])     PAST SURGICAL HISTORY:  Past Surgical History:  Procedure Laterality Date   SHOULDER SURGERY      HEMATOLOGY/ONCOLOGY HISTORY:  Oncology History   No history exists.    ALLERGIES:  has No Known Allergies.  MEDICATIONS:  Current Facility-Administered Medications  Medication Dose Route Frequency Provider Last Rate Last Admin   azithromycin (ZITHROMAX) 500 mg in sodium chloride 0.9 % 250 mL IVPB  500 mg Intravenous Q24H  Athena Masse, MD   Stopped at 05/07/20 0206   carbidopa-levodopa (SINEMET IR) 25-100 MG per tablet immediate release 1 tablet  1 tablet Oral TID Athena Masse, MD   1 tablet at 05/06/20 2210   cefTRIAXone (ROCEPHIN) 2 g in sodium chloride 0.9 % 100 mL IVPB  2 g Intravenous Q24H Athena Masse, MD   Stopped at 05/06/20 2256   donepezil (ARICEPT) tablet 5 mg  5 mg Oral Daily Shanlever, Pierce Crane, RPH       DULoxetine (CYMBALTA) DR capsule 60 mg  60 mg Oral Daily Athena Masse, MD       haloperidol lactate (HALDOL) injection 2 mg  2 mg Intravenous Q6H PRN [place] Donnamarie Poag, DO   2 mg at 05/07/20 0916   HYDROcodone-acetaminophen (NORCO/VICODIN) 5-325 MG per tablet 1 tablet  1 tablet Oral Q6H PRN Athena Masse, MD       ipratropium-albuterol (DUONEB) 0.5-2.5 (3) MG/3ML nebulizer solution 3 mL  3 mL Nebulization Q6H PRN [place] Eric J, DO       loratadine (CLARITIN) tablet 10 mg  10 mg Oral Daily Judd Gaudier V, MD       metoprolol tartrate (LOPRESSOR) injection 2.5 mg  2.5 mg Intravenous Q4H PRN Judd Gaudier V, MD   2.5 mg at 05/07/20 0456   metoprolol tartrate (LOPRESSOR) tablet 25 mg  25 mg Oral BID Athena Masse, MD   25 mg at 05/06/20 2210    VITAL SIGNS: BP Marland Kitchen)  101/59    Pulse 105    Temp 99.2 F (37.3 C) (Oral)    Resp 14    Ht 5' 7" (1.702 m)    Wt 200 lb (90.7 kg)    SpO2 97%    BMI 31.32 kg/m  Filed Weights   05/06/20 1620  Weight: 200 lb (90.7 kg)    Estimated body mass index is 31.32 kg/m as calculated from the following:   Height as of this encounter: 5' 7" (1.702 m).   Weight as of this encounter: 200 lb (90.7 kg).  LABS: CBC:    Component Value Date/Time   WBC 13.5 (H) 05/06/2020 1638   HGB 11.9 (L) 05/06/2020 1638   HGB 12.7 (L) 08/23/2014 2013   HCT 34.6 (L) 05/06/2020 1638   HCT 38.5 (L) 08/23/2014 2013   PLT 268 05/06/2020 1638   PLT 176 08/23/2014 2013   MCV 89.4 05/06/2020 1638   MCV 94 08/23/2014 2013   NEUTROABS 4.5 08/23/2014 2013    LYMPHSABS 1.2 08/23/2014 2013   MONOABS 0.8 08/23/2014 2013   EOSABS 0.3 08/23/2014 2013   BASOSABS 0.0 08/23/2014 2013   Comprehensive Metabolic Panel:    Component Value Date/Time   NA 138 05/06/2020 1638   NA 140 08/23/2014 2013   K 4.5 05/06/2020 1638   K 4.5 08/23/2014 2013   CL 104 05/06/2020 1638   CL 106 08/23/2014 2013   CO2 22 05/06/2020 1638   CO2 28 08/23/2014 2013   BUN 31 (H) 05/06/2020 1638   BUN 18 08/23/2014 2013   CREATININE 1.87 (H) 05/06/2020 1638   CREATININE 1.71 (H) 08/23/2014 2013   GLUCOSE 102 (H) 05/06/2020 1638   GLUCOSE 97 08/23/2014 2013   CALCIUM 9.2 05/06/2020 1638   CALCIUM 8.1 (L) 08/23/2014 2013   AST 31 05/06/2020 1638   AST 24 06/14/2013 1133   ALT 6 05/06/2020 1638   ALT 21 06/14/2013 1133   ALKPHOS 78 05/06/2020 1638   ALKPHOS 94 06/14/2013 1133   BILITOT 1.1 05/06/2020 1638   BILITOT 0.5 06/14/2013 1133   PROT 6.3 (L) 05/06/2020 1638   PROT 7.0 06/14/2013 1133   ALBUMIN 2.9 (L) 05/06/2020 1638   ALBUMIN 3.7 06/14/2013 1133    RADIOGRAPHIC STUDIES: DG Chest 1 View  Result Date: 05/06/2020 CLINICAL DATA:  86 year old male with history of shortness of breath and dyspnea. EXAM: CHEST  1 VIEW COMPARISON:  Chest x-ray 08/23/2014. FINDINGS: Multifocal airspace consolidation and interstitial prominence scattered asymmetrically throughout the lungs bilaterally, most confluent in the left upper lobe medially. Small left pleural effusion. No definite pneumothorax. Pulmonary vasculature is largely obscured. Heart size is normal. IMPRESSION: 1. Findings are concerning for severe multilobar bilateral pneumonia (left greater than right). The possibility of a centrally obstructing mass in the left upper lobe should be considered. Further evaluation with contrast enhanced chest CT could better evaluate these findings if clinically appropriate. Electronically Signed   By: Vinnie Langton M.D.   On: 05/06/2020 17:03   CT Chest W Contrast  Result Date:  05/06/2020 CLINICAL DATA:  Chest pain, dyspnea, episodic apnea EXAM: CT CHEST WITH CONTRAST TECHNIQUE: Multidetector CT imaging of the chest was performed during intravenous contrast administration. CONTRAST:  85m OMNIPAQUE IOHEXOL 300 MG/ML  SOLN COMPARISON:  None. FINDINGS: Cardiovascular: Global cardiac size within normal limits. Moderate calcification of the aortic valve leaflets. Extensive coronary artery calcification largely within the right coronary artery. No pericardial effusions. Moderate atherosclerotic calcification within the thoracic aorta without evidence of  aneurysm. Central pulmonary arteries are of normal caliber. Mediastinum/Nodes: There is extensive pathologic hilar and mediastinal adenopathy. Probable pathologic bilateral supraclavicular adenopathy. Index subcarinal lymph node measures 3.0 x 4.1 cm at axial image # 72/2 Lungs/Pleura: Moderate centrilobular emphysema. There is, superimposed, extensive multifocal airspace infiltrate within the left upper lobe, primarily, but scattered within the right lower lobe and right middle lobe in keeping with changes of multifocal pneumonia. However, there is, superimposed, a suspected mass within the left upper lobe more peripherally, poorly visualized due to adjacent infiltrate and peripherally collapsed left upper lobe, but roughly measuring 3.6 x 3.7 x 5.1 cm on coronal image # 51/5 and sagittal image # 121/6. There is an associated large left pleural effusion and it is unclear whether this represents a parapneumonic effusion or malignant effusion. Small right pleural effusion. There are innumerable scattered pulmonary nodules predominantly within the lung bases which are indeterminate, but are suspicious for intrapulmonary metastases. Upper Abdomen: Multiple nodules are seen within the a drawing glands bilaterally which are not well characterized on this examination. There are multiple low-attenuation lesions seen within the liver suspicious for  hepatic metastases given the additional findings. Musculoskeletal: Remote compression fracture of T11. No acute bone abnormality. No lytic or blastic bone lesion identified. IMPRESSION: Multifocal pulmonary infiltrates most in keeping with multifocal pneumonia in the acute setting. Large left and small right pleural effusions are noted. Left pleural effusion is indeterminate and may represent either a parapneumonic or malignant effusion. Focal mass within the left upper lobe with obliteration of the a bronchi and postobstructive collapse of the more peripheral left upper lobe, suspicious for a primary bronchogenic neoplasm. Suspected pathologic bilateral supraclavicular, mediastinal, and bilateral hilar adenopathy. Suspected hepatic metastases. Indeterminate adrenal nodules bilaterally. Repeat staging CT or PET-CT imaging could be performed once the patient's acute issues have resolved. Emphysema. Aortic Atherosclerosis (ICD10-I70.0) and Emphysema (ICD10-J43.9). Electronically Signed   By: Fidela Salisbury MD   On: 05/06/2020 18:46    PERFORMANCE STATUS (ECOG) : 4 - Bedbound  Review of Systems Unless otherwise noted, a complete review of systems is negative.  Physical Exam General: Ill-appearing Cardiovascular: Irregular Pulmonary: clear ant fields Abdomen: soft, nontender, + bowel sounds GU: no suprapubic tenderness Extremities: no edema, no joint deformities Skin: no rashes Neurological: Poorly responsive  IMPRESSION: Patient seen in the ER.  He is poorly responsive and unable to engage in a conversation regarding his goals.  I spoke with his daughter who is at bedside.  Patient is currently pending admission to the hospital.  Daughter met earlier today with Dr. Rogue Bussing and verbalized an understanding that patient is unlikely to be a candidate for aggressive cancer treatment.  She states that she would want her brother involved in any decision making.  She plans to call her brother and see if  he can come to the hospital.  We will plan to meet with family when he arrives.  Prior to this hospitalization, daughter says that patient was living at home with her brother.  At baseline, he ambulated with use of a walker but required some assistance with daily care.  He had some mild cognitive impairment/dementia but was conversational and engaged readily with family.  PLAN: -Best supportive care -Will plan to meet with family when they arrive -Recommend DNR and consideration of comfort care in the event the patient declines or fails to improve   Time Total: 60 minutes  Visit consisted of counseling and education dealing with the complex and emotionally intense issues of  symptom management and palliative care in the setting of serious and potentially life-threatening illness.Greater than 50%  of this time was spent counseling and coordinating care related to the above assessment and plan.  Signed by: Altha Harm, PhD, NP-C

## 2020-05-07 NOTE — Progress Notes (Signed)
Pt arrived to room 243 via stretcher from ED.  Receiving oxygen at 6L/min via [place]; placed on humidification.  Increased work of breathing noted with subcostal and intercostal margin retractions, use of accessory muscles.  Coarse crackles auscultated throughout, and audible from upper airway.  No airway obstruction noted.  Daughter at bedside.

## 2020-05-07 NOTE — Consult Note (Addendum)
Cardiology Consultation Note    Patient ID: Mike Smith, MRN: [id number], DOB/AGE: [birth year] 86 y.o. Admit date: 05/06/2020   Date of Consult: 05/07/2020 Primary Physician: Madelyn Brunner, MD Primary Cardiologist: None  Chief Complaint: sob Reason for Consultation: abnormal troponin Requesting MD: Dr. [place]  HPI: Duvall Comes is a 86 y.o. male with history of Parkinson's disease, dementia, interstitial lung disease not treated with oxygen, hypertension, chronic kidney disease grade 3 who was sent to the ER by his primary care physician after being noted to have oxygen saturations of 86 on room air.  Patient not able to give history.  He apparently had had decreased appetite and weakness.  No chest pain.  However troponins were drawn and noted to be 204 and 222.  BNP was 232.  His baseline creatinine is 1.7 and his creatinine which was 1.87 in the emergency room.  Chest CT showed multifocal pneumonia and large left and small right pleural effusions.  He had a left upper lobe focal mass with postobstructive collapse along with mediastinal and bilateral hilar adenopathy.  Patient was started on empiric antibiotics.  Consultation requested for abnormal troponin.  EKG revealed atrial fibrillation with no ischemia. Past Medical History:  Diagnosis Date  . Atrial fibrillation ([place])   . Dementia [facility])       Surgical History: Yes Past Surgical History:  Procedure Laterality Date  . SHOULDER SURGERY       Home Meds: Prior to Admission medications   Medication Sig Start Date End Date Taking? Authorizing Provider  acetaminophen (TYLENOL) 650 MG CR tablet Take 1,300 mg by mouth every 12 (twelve) hours as needed for Pain   Yes [provider]  alfuzosin (UROXATRAL) 10 MG 24 hr tablet Take 10 mg by mouth daily. 04/19/20  Yes [provider]  atorvastatin (LIPITOR) 20 MG tablet Take 20 mg by mouth daily. 03/05/20  Yes [provider]   carbidopa-levodopa (SINEMET IR) 25-100 MG tablet Take 1 tablet by mouth 3 (three) times daily. 04/13/20  Yes [provider]  cetirizine (ZYRTEC) 10 MG tablet Take 1 tablet by mouth daily. 04/17/20  Yes [provider]  donepezil (ARICEPT) 5 MG tablet Take 5 mg by mouth daily. 04/28/20  Yes [provider]  DULoxetine (CYMBALTA) 30 MG capsule TAKE 2 CAPSULES (60 MG TOTAL) BY MOUTH ONCE DAILY 03/30/20  Yes [provider]  HYDROcodone-acetaminophen (NORCO/VICODIN) 5-325 MG tablet Take by mouth. 12/13/15  Yes [provider]  Multiple Vitamin (MULTI-VITAMIN) tablet Take 1 tablet by mouth daily.   Yes [provider]    Inpatient Medications:  . alfuzosin  10 mg Oral Daily  . atorvastatin  20 mg Oral Daily  . carbidopa-levodopa  1 tablet Oral TID  . donepezil  5 mg Oral Daily  . DULoxetine  60 mg Oral Daily  . enoxaparin (LOVENOX) injection  30 mg Subcutaneous Q24H  . loratadine  10 mg Oral Daily  . metoprolol tartrate  25 mg Oral BID  . multivitamin with minerals  1 tablet Oral Daily   . azithromycin Stopped (05/07/20 0206)  . cefTRIAXone (ROCEPHIN)  IV Stopped (05/06/20 2256)    Allergies: No Known Allergies  Social History   Socioeconomic History  . Marital status: Married    Spouse name: Not on file  . Number of children: Not on file  . Years of education: Not on file  . Highest education level: Not on file  Occupational History  . Not  on file  Tobacco Use  . Smoking status: Former Research scientist (life sciences)  . Smokeless tobacco: Never Used  Substance and Sexual Activity  . Alcohol use: Not Currently  . Drug use: Never  . Sexual activity: Not Currently  Other Topics Concern  . Not on file  Social History Narrative  . Not on file   Social Determinants of Health   Financial Resource Strain:   . Difficulty of Paying Living Expenses:   Food Insecurity:   . Worried About Charity fundraiser in the Last Year:   . Arboriculturist in the Last  Year:   Transportation Needs:   . Film/video editor (Medical):   Marland Kitchen Lack of Transportation (Non-Medical):   Physical Activity:   . Days of Exercise per Week:   . Minutes of Exercise per Session:   Stress:   . Feeling of Stress :   Social Connections:   . Frequency of Communication with Friends and Family:   . Frequency of Social Gatherings with Friends and Family:   . Attends Religious Services:   . Active Member of Clubs or Organizations:   . Attends Archivist Meetings:   Marland Kitchen Marital Status:   Intimate Partner Violence:   . Fear of Current or Ex-Partner:   . Emotionally Abused:   Marland Kitchen Physically Abused:   . Sexually Abused:      History reviewed. No pertinent family history.   Review of Systems: A 12-system review of systems was performed and is negative except as noted in the HPI.  Labs: No results for input(s): CKTOTAL, CKMB, TROPONINI in the last 72 hours. Lab Results  Component Value Date   WBC 13.5 (H) 05/06/2020   HGB 11.9 (L) 05/06/2020   HCT 34.6 (L) 05/06/2020   MCV 89.4 05/06/2020   PLT 268 05/06/2020    Recent Labs  Lab 05/06/20 1638  NA 138  K 4.5  CL 104  CO2 22  BUN 31*  CREATININE 1.87*  CALCIUM 9.2  PROT 6.3*  BILITOT 1.1  ALKPHOS 78  ALT 6  AST 31  GLUCOSE 102*   No results found for: CHOL, HDL, LDLCALC, TRIG No results found for: DDIMER  Radiology/Studies:  DG Chest 1 View  Result Date: 05/06/2020 CLINICAL DATA:  86 year old male with history of shortness of breath and dyspnea. EXAM: CHEST  1 VIEW COMPARISON:  Chest x-ray 08/23/2014. FINDINGS: Multifocal airspace consolidation and interstitial prominence scattered asymmetrically throughout the lungs bilaterally, most confluent in the left upper lobe medially. Small left pleural effusion. No definite pneumothorax. Pulmonary vasculature is largely obscured. Heart size is normal. IMPRESSION: 1. Findings are concerning for severe multilobar bilateral pneumonia (left greater than  right). The possibility of a centrally obstructing mass in the left upper lobe should be considered. Further evaluation with contrast enhanced chest CT could better evaluate these findings if clinically appropriate. Electronically Signed   By: Vinnie Langton M.D.   On: 05/06/2020 17:03   CT Chest W Contrast  Result Date: 05/06/2020 CLINICAL DATA:  Chest pain, dyspnea, episodic apnea EXAM: CT CHEST WITH CONTRAST TECHNIQUE: Multidetector CT imaging of the chest was performed during intravenous contrast administration. CONTRAST:  42mL OMNIPAQUE IOHEXOL 300 MG/ML  SOLN COMPARISON:  None. FINDINGS: Cardiovascular: Global cardiac size within normal limits. Moderate calcification of the aortic valve leaflets. Extensive coronary artery calcification largely within the right coronary artery. No pericardial effusions. Moderate atherosclerotic calcification within the thoracic aorta without evidence of aneurysm. Central pulmonary arteries are of normal  caliber. Mediastinum/Nodes: There is extensive pathologic hilar and mediastinal adenopathy. Probable pathologic bilateral supraclavicular adenopathy. Index subcarinal lymph node measures 3.0 x 4.1 cm at axial image # 72/2 Lungs/Pleura: Moderate centrilobular emphysema. There is, superimposed, extensive multifocal airspace infiltrate within the left upper lobe, primarily, but scattered within the right lower lobe and right middle lobe in keeping with changes of multifocal pneumonia. However, there is, superimposed, a suspected mass within the left upper lobe more peripherally, poorly visualized due to adjacent infiltrate and peripherally collapsed left upper lobe, but roughly measuring 3.6 x 3.7 x 5.1 cm on coronal image # 51/5 and sagittal image # 121/6. There is an associated large left pleural effusion and it is unclear whether this represents a parapneumonic effusion or malignant effusion. Small right pleural effusion. There are innumerable scattered pulmonary nodules  predominantly within the lung bases which are indeterminate, but are suspicious for intrapulmonary metastases. Upper Abdomen: Multiple nodules are seen within the a drawing glands bilaterally which are not well characterized on this examination. There are multiple low-attenuation lesions seen within the liver suspicious for hepatic metastases given the additional findings. Musculoskeletal: Remote compression fracture of T11. No acute bone abnormality. No lytic or blastic bone lesion identified. IMPRESSION: Multifocal pulmonary infiltrates most in keeping with multifocal pneumonia in the acute setting. Large left and small right pleural effusions are noted. Left pleural effusion is indeterminate and may represent either a parapneumonic or malignant effusion. Focal mass within the left upper lobe with obliteration of the a bronchi and postobstructive collapse of the more peripheral left upper lobe, suspicious for a primary bronchogenic neoplasm. Suspected pathologic bilateral supraclavicular, mediastinal, and bilateral hilar adenopathy. Suspected hepatic metastases. Indeterminate adrenal nodules bilaterally. Repeat staging CT or PET-CT imaging could be performed once the patient's acute issues have resolved. Emphysema. Aortic Atherosclerosis (ICD10-I70.0) and Emphysema (ICD10-J43.9). Electronically Signed   By: Fidela Salisbury MD   On: 05/06/2020 18:46    Wt Readings from Last 3 Encounters:  05/06/20 90.7 kg  03/13/19 90.7 kg    EKG: Atrial fibrillation with controlled ventricular response.  No ischemia.  Physical Exam:  Blood pressure 125/82, pulse 99, temperature 99.2 F (37.3 C), temperature source Oral, resp. rate 17, height 5\' 7"  (1.702 m), weight 90.7 kg, SpO2 91 %. Body mass index is 31.32 kg/m. General: Well developed, well nourished, in no acute distress. Head: Normocephalic, atraumatic, sclera non-icteric, no xanthomas, nares are without discharge.  Neck: Negative for carotid bruits. JVD not  elevated. Lungs: Clear bilaterally to auscultation without wheezes, rales, or rhonchi. Breathing is unlabored. Heart: Irregular regular rhythm Abdomen: Soft, non-tender, non-distended with normoactive bowel sounds. No hepatomegaly. No rebound/guarding. No obvious abdominal masses. Msk:  Strength and tone appear normal for age. Extremities: No clubbing or cyanosis. No edema.  Distal pedal pulses are 2+ and equal bilaterally. Neuro: Confused.  Not able to give history.     Assessment and Plan  86 year old male with history of interstitial lung disease, dementia, Parkinson's disease, chronic kidney disease who was sent to the ER by his primary care physician after noting his pulse ox was 86% on room air.  Work-up showed A. fib with RVR with an elevated troponin in the 200s.  Patient is a difficult historian but did not complain of chest pain.  He continues to have no complaints of chest pain.  Chest x-ray revealed likely metastatic bronchogenic carcinoma with multifocal pneumonia.  Pulse ox was 92% on 2 L.  1.  Abnormal troponin-not related to acute coronary syndrome  based on history, electrocardiogram and values.  Patient is not a candidate for heparin.  Not a candidate for invasive or noninvasive ischemic work-up given comorbidities.  Would continue with his current medications including metoprolol tartrate 25 mg twice daily for A. fib rate control.  Will discontinue atorvastatin given age and comorbidities.  Elevated troponin likely demand.  2.  Atrial fibrillation-likely secondary to multiple lung issues including pneumonia and probable metastatic bronchogenic carcinoma.  We will continue with rate control with metoprolol.  Not a candidate for chronic anticoagulation given comorbidities and fall risk including bleeding risk.  3.  Respiratory failure-likely multifactorial to include bronchogenic carcinoma as well as airspace disease.  Does not appear to have significant pulmonary edema at present.   We will continue to follow with empiric antibiotics.  Signed, Teodoro Spray MD 05/07/2020, 7:42 AM Pager: [phone]

## 2020-05-07 NOTE — ED Notes (Addendum)
Assisted Nurse with changing patient brief and linen.AS

## 2020-05-07 NOTE — Progress Notes (Signed)
Chaplain offered Mike Smith and his daughter and son supportive presence as they continue to make meaning of his rapid decline (was at home yesterday and was able to talk to daughter on phone last night). Children continue to process Mike Smith's wishes which were stated 10 years ago to "do all you can." Children are in similar situation to what they were in with their mother and they had to make health care decisions while supporting Mike Smith. Mike Smith has additional family including grandchildren who would like to visit. Chaplain offered listening process and reflective thinking to aid family in processing. Prayer and blessing were offered. Mike Smith was agitated at points in visit and opened eyes on a few occasions other wise he had eyes closed and slept during visit. No verbal communication.     05/07/20 1800  Clinical Encounter Type  Visited With Patient and family together  Visit Type Initial  Referral From Nurse  Spiritual Encounters  Spiritual Needs Grief support;Prayer  Stress Factors  Patient Stress Factors Health changes  Family Stress Factors Loss

## 2020-05-07 NOTE — ED Notes (Signed)
Daughter at bedside, introduced myself.

## 2020-05-07 NOTE — Progress Notes (Addendum)
I met with patient's daughter.  Patient's son participated in the conversation via phone.  Together, we reviewed patient's current medical problems.  Family are in agreement with continued treatment to try to resolve patient's pneumonia.  They verbalized understanding that patient's cancer is likely advanced stage and incurable.  They also verbalized understanding that he likely would have limited treatment options, if any.  They also recognize the patient's pneumonia might ultimately prove to be a terminal event.  They have consented to a thoracentesis today to improve patient's comfort.  We discussed CODE STATUS at length.  Family say that patient's wife died 77 years ago at [facility].  At that time, family felt that doctors were pushing them towards comfort care when they were not ready.  Ultimately, patient deferred decisions to son and daughter who opted for DNR and comfort care.  The patient had wanted full code and PEG tube.  Family say that patient at that time requested that "everything be done" to prolong his life.  Patient's son and daughter seem to recognize that resuscitation would likely prove futile in the setting of advanced/terminal cancer.  They want to talk more with each other prior to making any decisions.   Time Total: 20 minutes  Visit consisted of counseling and education dealing with the complex and emotionally intense issues of symptom management and palliative care in the setting of serious and potentially life-threatening illness.Greater than 50%  of this time was spent counseling and coordinating care related to the above assessment and plan.  Signed by: Altha Harm, PhD, NP-C

## 2020-05-07 NOTE — Consult Note (Signed)
[facility] NOTE  Patient Care Team: Madelyn Brunner, MD as PCP - General (Internal Medicine)  CHIEF COMPLAINTS/PURPOSE OF CONSULTATION: Lung mass/pleural effusion  HISTORY OF PRESENTING ILLNESS: Patient is currently encephalopathic; unable to give history.  Accompanied by daughter.   Mike Smith 86 y.o.  male history of Parkinson's disease chronic medical problems-COPD dementia CKD stage III is currently admitted to hospital for worsening shortness of breath cough.  Emergency room-patient noted to be hypoxic; also A. Fib.  Given agitation patient received Ativan-currently not responsive to verbal stimuli.  CT scan-emergency room showed moderate-sized left-sided pleural effusion; multifocal pneumonia; left-sided upper lobe mass; also noted to have mediastinal adenopathy; along with subtle liver lesions; adrenal lesions-highly suspicious for malignancy.  Oncology has been consult for further evaluation recommendations.  Kidney function-creatinine 1.8.   Review of Systems  Unable to perform ROS: Mental status change     MEDICAL HISTORY:  Past Medical History:  Diagnosis Date  . Atrial fibrillation ([place])   . Dementia [facility])     SURGICAL HISTORY: Past Surgical History:  Procedure Laterality Date  . SHOULDER SURGERY      SOCIAL HISTORY: Social History   Socioeconomic History  . Marital status: Married    Spouse name: Not on file  . Number of children: Not on file  . Years of education: Not on file  . Highest education level: Not on file  Occupational History  . Not on file  Tobacco Use  . Smoking status: Former Research scientist (life sciences)  . Smokeless tobacco: Never Used  Substance and Sexual Activity  . Alcohol use: Not Currently  . Drug use: Never  . Sexual activity: Not Currently  Other Topics Concern  . Not on file  Social History Narrative  . Not on file   Social Determinants of Health   Financial Resource Strain:   . Difficulty of Paying  Living Expenses:   Food Insecurity:   . Worried About Charity fundraiser in the Last Year:   . Arboriculturist in the Last Year:   Transportation Needs:   . Film/video editor (Medical):   Marland Kitchen Lack of Transportation (Non-Medical):   Physical Activity:   . Days of Exercise per Week:   . Minutes of Exercise per Session:   Stress:   . Feeling of Stress :   Social Connections:   . Frequency of Communication with Friends and Family:   . Frequency of Social Gatherings with Friends and Family:   . Attends Religious Services:   . Active Member of Clubs or Organizations:   . Attends Archivist Meetings:   Marland Kitchen Marital Status:   Intimate Partner Violence:   . Fear of Current or Ex-Partner:   . Emotionally Abused:   Marland Kitchen Physically Abused:   . Sexually Abused:     FAMILY HISTORY: History reviewed. No pertinent family history.  ALLERGIES:  has No Known Allergies.  MEDICATIONS:  Current Facility-Administered Medications  Medication Dose Route Frequency Provider Last Rate Last Admin  . azithromycin (ZITHROMAX) 500 mg in sodium chloride 0.9 % 250 mL IVPB  500 mg Intravenous Q24H Athena Masse, MD   Stopped at 05/07/20 0206  . carbidopa-levodopa (SINEMET IR) 25-100 MG per tablet immediate release 1 tablet  1 tablet Oral TID Athena Masse, MD   1 tablet at 05/06/20 2210  . cefTRIAXone (ROCEPHIN) 2 g in sodium chloride 0.9 % 100 mL IVPB  2 g Intravenous Q24H Athena Masse,  MD   Stopped at 05/06/20 2256  . donepezil (ARICEPT) tablet 5 mg  5 mg Oral Daily Lu Duffel, [facility]      . DULoxetine (CYMBALTA) DR capsule 60 mg  60 mg Oral Daily Judd Gaudier V, MD      . haloperidol lactate (HALDOL) injection 2 mg  2 mg Intravenous Q6H PRN [place] Donnamarie Poag, DO   2 mg at 05/07/20 0916  . HYDROcodone-acetaminophen (NORCO/VICODIN) 5-325 MG per tablet 1 tablet  1 tablet Oral Q6H PRN Athena Masse, MD      . ipratropium-albuterol (DUONEB) 0.5-2.5 (3) MG/3ML nebulizer solution 3 mL  3 mL  Nebulization Q6H PRN [place] Eric J, DO      . loratadine (CLARITIN) tablet 10 mg  10 mg Oral Daily Judd Gaudier V, MD      . metoprolol tartrate (LOPRESSOR) injection 2.5 mg  2.5 mg Intravenous Q4H PRN Athena Masse, MD   2.5 mg at 05/07/20 0456  . metoprolol tartrate (LOPRESSOR) tablet 25 mg  25 mg Oral BID Athena Masse, MD   25 mg at 05/06/20 2210   Current Outpatient Medications  Medication Sig Dispense Refill  . acetaminophen (TYLENOL) 650 MG CR tablet Take 1,300 mg by mouth every 12 (twelve) hours as needed for Pain    . alfuzosin (UROXATRAL) 10 MG 24 hr tablet Take 10 mg by mouth daily.    Marland Kitchen atorvastatin (LIPITOR) 20 MG tablet Take 20 mg by mouth daily.    . carbidopa-levodopa (SINEMET IR) 25-100 MG tablet Take 1 tablet by mouth 3 (three) times daily.    . cetirizine (ZYRTEC) 10 MG tablet Take 1 tablet by mouth daily.    Marland Kitchen donepezil (ARICEPT) 5 MG tablet Take 5 mg by mouth daily.    . DULoxetine (CYMBALTA) 30 MG capsule TAKE 2 CAPSULES (60 MG TOTAL) BY MOUTH ONCE DAILY    . HYDROcodone-acetaminophen (NORCO/VICODIN) 5-325 MG tablet Take by mouth.    . Multiple Vitamin (MULTI-VITAMIN) tablet Take 1 tablet by mouth daily.        Marland Kitchen  PHYSICAL EXAMINATION:  Vitals:   05/07/20 1400 05/07/20 1430  BP: 113/76 (!) 101/59  Pulse: 97 105  Resp: 16 14  Temp:    SpO2: 93% 97%   Filed Weights   05/06/20 1620  Weight: 200 lb (90.7 kg)    Physical Exam Constitutional:      Comments: Cachectic appearing Caucasian male patient.  Nasal cannula oxygen.  Seen in the emergency room; on stretcher.  Accompanied by daughter.  HENT:     Head: Normocephalic and atraumatic.     Mouth/Throat:     Pharynx: No oropharyngeal exudate.  Eyes:     Pupils: Pupils are equal, round, and reactive to light.  Cardiovascular:     Rate and Rhythm: Normal rate and regular rhythm.  Pulmonary:     Effort: No respiratory distress.     Breath sounds: No wheezing.     Comments: Bilateral coarse breath  sounds decreased breath sounds on the left side compared to right. Abdominal:     General: Bowel sounds are normal. There is no distension.     Palpations: Abdomen is soft. There is no mass.     Tenderness: There is no abdominal tenderness. There is no guarding or rebound.  Musculoskeletal:        General: No tenderness. Normal range of motion.     Cervical back: Normal range of motion and neck supple.  Skin:  General: Skin is warm.  Neurological:     Mental Status: He is alert.     Comments: Patient drowsy; not following any verbal commands.   Psychiatric:        Mood and Affect: Affect normal.      LABORATORY DATA:  I have reviewed the data as listed Lab Results  Component Value Date   WBC 13.5 (H) 05/06/2020   HGB 11.9 (L) 05/06/2020   HCT 34.6 (L) 05/06/2020   MCV 89.4 05/06/2020   PLT 268 05/06/2020   Recent Labs    05/06/20 1638  NA 138  K 4.5  CL 104  CO2 22  GLUCOSE 102*  BUN 31*  CREATININE 1.87*  CALCIUM 9.2  GFRNONAA 31*  GFRAA 36*  PROT 6.3*  ALBUMIN 2.9*  AST 31  ALT 6  ALKPHOS 78  BILITOT 1.1    RADIOGRAPHIC STUDIES: I have personally reviewed the radiological images as listed and agreed with the findings in the report. DG Chest 1 View  Result Date: 05/06/2020 CLINICAL DATA:  86 year old male with history of shortness of breath and dyspnea. EXAM: CHEST  1 VIEW COMPARISON:  Chest x-ray 08/23/2014. FINDINGS: Multifocal airspace consolidation and interstitial prominence scattered asymmetrically throughout the lungs bilaterally, most confluent in the left upper lobe medially. Small left pleural effusion. No definite pneumothorax. Pulmonary vasculature is largely obscured. Heart size is normal. IMPRESSION: 1. Findings are concerning for severe multilobar bilateral pneumonia (left greater than right). The possibility of a centrally obstructing mass in the left upper lobe should be considered. Further evaluation with contrast enhanced chest CT could  better evaluate these findings if clinically appropriate. Electronically Signed   By: Vinnie Langton M.D.   On: 05/06/2020 17:03   CT Chest W Contrast  Result Date: 05/06/2020 CLINICAL DATA:  Chest pain, dyspnea, episodic apnea EXAM: CT CHEST WITH CONTRAST TECHNIQUE: Multidetector CT imaging of the chest was performed during intravenous contrast administration. CONTRAST:  38mL OMNIPAQUE IOHEXOL 300 MG/ML  SOLN COMPARISON:  None. FINDINGS: Cardiovascular: Global cardiac size within normal limits. Moderate calcification of the aortic valve leaflets. Extensive coronary artery calcification largely within the right coronary artery. No pericardial effusions. Moderate atherosclerotic calcification within the thoracic aorta without evidence of aneurysm. Central pulmonary arteries are of normal caliber. Mediastinum/Nodes: There is extensive pathologic hilar and mediastinal adenopathy. Probable pathologic bilateral supraclavicular adenopathy. Index subcarinal lymph node measures 3.0 x 4.1 cm at axial image # 72/2 Lungs/Pleura: Moderate centrilobular emphysema. There is, superimposed, extensive multifocal airspace infiltrate within the left upper lobe, primarily, but scattered within the right lower lobe and right middle lobe in keeping with changes of multifocal pneumonia. However, there is, superimposed, a suspected mass within the left upper lobe more peripherally, poorly visualized due to adjacent infiltrate and peripherally collapsed left upper lobe, but roughly measuring 3.6 x 3.7 x 5.1 cm on coronal image # 51/5 and sagittal image # 121/6. There is an associated large left pleural effusion and it is unclear whether this represents a parapneumonic effusion or malignant effusion. Small right pleural effusion. There are innumerable scattered pulmonary nodules predominantly within the lung bases which are indeterminate, but are suspicious for intrapulmonary metastases. Upper Abdomen: Multiple nodules are seen within  the a drawing glands bilaterally which are not well characterized on this examination. There are multiple low-attenuation lesions seen within the liver suspicious for hepatic metastases given the additional findings. Musculoskeletal: Remote compression fracture of T11. No acute bone abnormality. No lytic or blastic bone lesion identified. IMPRESSION:  Multifocal pulmonary infiltrates most in keeping with multifocal pneumonia in the acute setting. Large left and small right pleural effusions are noted. Left pleural effusion is indeterminate and may represent either a parapneumonic or malignant effusion. Focal mass within the left upper lobe with obliteration of the a bronchi and postobstructive collapse of the more peripheral left upper lobe, suspicious for a primary bronchogenic neoplasm. Suspected pathologic bilateral supraclavicular, mediastinal, and bilateral hilar adenopathy. Suspected hepatic metastases. Indeterminate adrenal nodules bilaterally. Repeat staging CT or PET-CT imaging could be performed once the patient's acute issues have resolved. Emphysema. Aortic Atherosclerosis (ICD10-I70.0) and Emphysema (ICD10-J43.9). Electronically Signed   By: Fidela Salisbury MD   On: 05/06/2020 18:46    Mass of upper lobe of left lung #86 year old male patient with multiple medical problems including Parkinson's disease dementia; chronic kidney disease; COPD/long-term history of smoking-currently admitted to hospital for worsening shortness of breath cough  #Acute hypoxic respiratory failure -CT scan left upper lobe mass/moderate left pleural effusion; bilateral pneumonia  #Newly diagnosed A. fib with RVR  #Mental status changes-metabolic encephalopathy [Ativan/underlying pneumonia] vs-question malignancy of the brain.  #Multiple medical problems-underlying dementia/chronic kidney disease  Recommendations:  I had a long discussion with the patient's daughter regarding above concerning findings on the CT  scan.  Highly concerning for malignancy.  However to confirm malignancy we would like to get tissue diagnosis.  Left-sided thoracentesis-both diagnostic and therapeutic at this time.  I also had a very long discussion with the patient daughter that-given his multiple comorbidities/poor performance status-is not a candidate for any chemotherapy for his presumed lung cancer.  # However given patient's A. Fib/RVR metabolic encephalopathy-I am not sure if patient is an immediate candidate for thoracentesis.  I would recommend continued scope of care-treatment of underlying pneumonia; A. fib etc.   #Goals of care/advance directives-patient had previously [> 10 years ago] expressed to the family that he would want "aggressive measures" in case of serious illness.  However given his underlying incurable/malignancy-I would not recommend any aggressive measures like chest compressions or intubation etc.  Daughter understands the gravity of the situation; wants her brother to be involved to make decisions.  I discussed with Josh; with palliative care.   Thank you Dr.[place] for allowing me to participate in the care of your pleasant patient. Please do not hesitate to contact me with questions or concerns in the interim.  Discussed with Dr. [place]   # I reviewed the blood work- with the patient in detail; also reviewed the imaging independently [as summarized above]; and with the patient in detail.      All questions were answered. The patient knows to call the clinic with any problems, questions or concerns.   Cammie Sickle, MD 05/07/2020 3:05 PM

## 2020-05-08 ENCOUNTER — Inpatient Hospital Stay: Payer: Medicare Other

## 2020-05-08 DIAGNOSIS — Z515 Encounter for palliative care: Secondary | ICD-10-CM | POA: Diagnosis not present

## 2020-05-08 DIAGNOSIS — J9601 Acute respiratory failure with hypoxia: Secondary | ICD-10-CM | POA: Diagnosis not present

## 2020-05-08 LAB — COMPREHENSIVE METABOLIC PANEL
ALT: 21 U/L (ref 0–44)
AST: 28 U/L (ref 15–41)
Albumin: 2.7 g/dL — ABNORMAL LOW (ref 3.5–5.0)
Alkaline Phosphatase: 80 U/L (ref 38–126)
Anion gap: 11 (ref 5–15)
BUN: 29 mg/dL — ABNORMAL HIGH (ref 8–23)
CO2: 25 mmol/L (ref 22–32)
Calcium: 8.8 mg/dL — ABNORMAL LOW (ref 8.9–10.3)
Chloride: 104 mmol/L (ref 98–111)
Creatinine, Ser: 1.83 mg/dL — ABNORMAL HIGH (ref 0.61–1.24)
GFR calc Af Amer: 37 mL/min — ABNORMAL LOW (ref >60)
GFR calc non Af Amer: 32 mL/min — ABNORMAL LOW (ref >60)
Glucose, Bld: 79 mg/dL (ref 70–99)
Potassium: 4.5 mmol/L (ref 3.5–5.1)
Sodium: 140 mmol/L (ref 135–145)
Total Bilirubin: 0.9 mg/dL (ref 0.3–1.2)
Total Protein: 6.3 g/dL — ABNORMAL LOW (ref 6.5–8.1)

## 2020-05-08 LAB — CBC
HCT: 38.6 % — ABNORMAL LOW (ref 39.0–52.0)
Hemoglobin: 12.5 g/dL — ABNORMAL LOW (ref 13.0–17.0)
MCH: 30.5 pg (ref 26.0–34.0)
MCHC: 32.4 g/dL (ref 30.0–36.0)
MCV: 94.1 fL (ref 80.0–100.0)
Platelets: 299 10*3/uL (ref 150–400)
RBC: 4.1 MIL/uL — ABNORMAL LOW (ref 4.22–5.81)
RDW: 15.8 % — ABNORMAL HIGH (ref 11.5–15.5)
WBC: 14.9 10*3/uL — ABNORMAL HIGH (ref 4.0–10.5)
nRBC: 0 % (ref 0.0–0.2)

## 2020-05-08 LAB — LACTATE DEHYDROGENASE, PLEURAL OR PERITONEAL FLUID: LD, Fluid: 1425 U/L — ABNORMAL HIGH (ref 3–23)

## 2020-05-08 LAB — GLUCOSE, PLEURAL OR PERITONEAL FLUID: Glucose, Fluid: 77 mg/dL

## 2020-05-08 LAB — BODY FLUID CELL COUNT WITH DIFFERENTIAL
Eos, Fluid: 0 %
Lymphs, Fluid: 2 %
Monocyte-Macrophage-Serous Fluid: 1 %
Neutrophil Count, Fluid: 6 %
Other Cells, Fluid: 91 %
Total Nucleated Cell Count, Fluid: 2352 cu mm

## 2020-05-08 LAB — ALBUMIN, PLEURAL OR PERITONEAL FLUID: Albumin, Fluid: 2 g/dL

## 2020-05-08 LAB — PROTEIN, PLEURAL OR PERITONEAL FLUID: Total protein, fluid: 3.7 g/dL

## 2020-05-08 LAB — HIV ANTIBODY (ROUTINE TESTING W REFLEX): HIV Screen 4th Generation wRfx: NONREACTIVE

## 2020-05-08 LAB — MAGNESIUM: Magnesium: 1.9 mg/dL (ref 1.7–2.4)

## 2020-05-08 MED ORDER — LORAZEPAM 2 MG/ML IJ SOLN
0.5000 mg | Freq: Once | INTRAMUSCULAR | Status: AC
  Administered 2020-05-08: 0.5 mg via INTRAVENOUS
  Filled 2020-05-08: qty 1

## 2020-05-08 MED ORDER — METOPROLOL TARTRATE 5 MG/5ML IV SOLN
2.5000 mg | Freq: Once | INTRAVENOUS | Status: AC
  Administered 2020-05-08: 2.5 mg via INTRAVENOUS
  Filled 2020-05-08: qty 5

## 2020-05-08 MED ORDER — ENSURE ENLIVE PO LIQD
237.0000 mL | Freq: Three times a day (TID) | ORAL | Status: DC
  Administered 2020-05-08 – 2020-05-14 (×9): 237 mL via ORAL

## 2020-05-08 MED ORDER — MAGNESIUM SULFATE 2 GM/50ML IV SOLN
2.0000 g | Freq: Once | INTRAVENOUS | Status: AC
  Administered 2020-05-08: 2 g via INTRAVENOUS
  Filled 2020-05-08: qty 50

## 2020-05-08 NOTE — Progress Notes (Signed)
   05/07/20 1956  Assess: MEWS Score  Temp 98.1 F (36.7 C)  BP 124/68  Pulse Rate (!) 143  Resp (!) 25  SpO2 93 %  O2 Device Nasal Cannula  O2 Flow Rate (L/min) 6 L/min  Assess: MEWS Score  MEWS Temp 0  MEWS Systolic 0  MEWS Pulse 3  MEWS RR 1  MEWS LOC 2  MEWS Score 6  MEWS Score Color Red  Assess: if the MEWS score is Yellow or Red  Were vital signs taken at a resting state? Yes  Focused Assessment Change from prior assessment (see assessment flowsheet)  Early Detection of Sepsis Score *See Row Information* Medium  MEWS guidelines implemented *See Row Information* Yes  Treat  MEWS Interventions Administered prn meds/treatments  Pain Scale Faces  Pain Score 0  Faces Pain Scale 0  Take Vital Signs  Increase Vital Sign Frequency  Red: Q 1hr X 4 then Q 4hr X 4, if remains red, continue Q 4hrs  Escalate  MEWS: Escalate Red: discuss with charge nurse/RN and provider, consider discussing with RRT  Notify: Charge Nurse/RN  Name of Charge Nurse/RN Notified Adrienne  Date Charge Nurse/RN Notified 05/07/20  Time Charge Nurse/RN Notified 2010  Notify: Provider  Provider Name/Title Ouma NP  Date Provider Notified 05/07/20  Time Provider Notified 2018  Notification Type Page  Notification Reason Change in status  Response No new orders  Date of Provider Response 05/07/20  Document  Patient Outcome Stabilized after interventions  Progress note created (see row info) Yes

## 2020-05-08 NOTE — Progress Notes (Signed)
PROGRESS NOTE    Mike Smith  UJW:[id number] DOB: April 07, 1928 DOA: 05/06/2020 PCP: Madelyn Brunner, MD    Brief Narrative:  Mike Smith is a 86 y.o. male with medical history significant for HTN, ILD not on oxygen, Parkinson's disease, dementia, CKD 3, who presents to the emergency room with a several day history of shortness of breath, seen by his PCP at [facility] clinic, where his O2 sat was 86% on room air.  Most of history obtained from son at bedside who states that he had a nonproductive cough but without fever or chills.  Has not received Covid vaccine.   Has decreased appetite and weakness.  No complaints of chest pain.  No reports of nausea vomiting or change in bowel habits or abdominal pain  On arrival, he was noted to be in A. fib with a rate of 115.  BP 143/80, O2 sat 90% on room air improving to the mid 90s on 2 L.  Blood work with WBC 13,000, hemoglobin 11.9.  Troponin 204>> 222, BNP 232.  Creatinine 1.87 which is about his baseline around 1.71.  CT chest showed multifocal pneumonia, large left and small right pleural effusions, left upper lobe focal mass with postobstructive collapse, mediastinal and bilateral hilar adenopathy and suspected hepatic metastases.  The emergency room provider spoke with cardiologist, Dr. Ubaldo Glassing regarding elevated troponin and he recommended against heparin infusion at this time.  Patient started on systemic antibiotics.  Hospitalist consulted for admission.   Assessment & Plan:   Principal Problem:   Acute respiratory failure with hypoxia (HCC) Active Problems:   Multifocal pneumonia   Mass of upper lobe of left lung   Elevated troponin   Benign essential hypertension   CRF (chronic renal failure), stage 3 (moderate)   ILD (interstitial lung disease) (HCC)   Parkinson disease (HCC)   Pleural effusion   Hepatic metastases on CT [facility])   New onset atrial fibrillation (HCC)   Palliative care encounter   Pressure injury  of skin   Acute hypoxic respiratory failure, present on admission Acute metabolic encephalopathy, present on admission Hx interstitial lung disease Patient presenting from PCP office with hypoxia with SPO2 86% on room air.  Not on oxygen outpatient.  Etiology likely multifactorial secondary to multifocal pneumonia, likely metastatic bronchogenic carcinoma, bilateral pleural effusions greater on left and underlying history of ILD. --Continue treatment as below --Continue supplemental oxygen, maintain SPO2 greater than 88%; currently on 6 L nasal cannula with SPO2 96%  Multifocal pneumonia Office with hypoxia with SPO2 86% on room air.  Chest x-ray notable for multifocal airspace consolidation bilaterally.  Chest notable for multifocal pneumonia large left pleural effusion, small right pleural effusion and a mass left upper lobe.  Covid-19 PCR negative. --Continue antibiotics with azithromycin and ceftriaxone --Supplemental oxygen to maintain SPO2 greater than 88%; currently on 6 L nasal cannula with SPO2 96% --Supportive care  Bilateral pleural effusion, left greater than right CT notable for a large left pleural effusion.  Suspect malignant.  Discussed with patient's daughter and son, patient wanted all aggressive measures and okay for proceeding with thoracentesis. --IR consulted for thoracentesis --Will assess Lights criteria to ascertain exudative versus transudate of effusion --Also obtain cytology to assist with diagnosis --Continue supplemental oxygen as above  Left upper lobe lung mass with likely liver metastasis CT chest notable for 3.6 x 3.7 x 5.1 cm left upper lobe mass causing peripheral left upper lobe collapse with associated oral supraclavicular, mediastinal and bilateral hilar  adenopathy and suspected hepatic metastases.  Discussion with daughter, patient requested all aggressive measures and they would like to proceed with further invasive testing.  Discussed with daughter  and daughter-in-law extensively regarding grim prognosis, especially given his poor functional status and underlying Parkinson's dementia. --Oncology and palliative care following, appreciate assistance --IR thoracentesis as above --Overall very poor/grim prognosis  Paroxysmal atrial fibrillation with RVR Etiology likely secondary to increased metabolic demand with multifocal pneumonia and probable metastatic bronchogenic carcinoma. --Continue rate control with metoprolol tartrate 25 mg p.o. twice daily --Metoprolol tartrate 5 mg IV q3h prn for sustained heart rate greater than 120 for 5 minutes --Not a candidate for chronic anticoagulation secondary to comorbidities and fall/bleeding risk --Continue to monitor on telemetry  Hypomagnesemia Magnesium 1.9, will replete today. --Electrolytes closely daily  Elevated troponin Etiology likely secondary to demand ischemia in the setting of A. fib with RVR.  Seen by cardiology.  Not a candidate for heparin or invasive or noninvasive ischemic work-up given his comorbidities. --Troponin 204>222 --Continue metoprolol tartrate 25 mg p.o. twice daily  Parkinson's dementia --Continue Sinemet, Aricept when mental status improves  CKD stage IIIb Baseline creatinine appears to be 1.71, last noted in November 2015.  Creatinine on admission 1.87. --Cr 1.87>1.83 --Avoid nephrotoxins, renally dose all medications. --Monitor renal function closely daily    DVT prophylaxis: SCDs, holding chemical DVT prophylaxis for planned procedure Code Status: DNR Family Communication: Updated patient's son at bedside this morning  Disposition Plan:  Status is: Inpatient  Remains inpatient appropriate because:Hemodynamically unstable, Altered mental status, Ongoing diagnostic testing needed not appropriate for outpatient work up, Unsafe d/c plan and IV treatments appropriate due to intensity of illness or inability to take PO ultimately very grim/poor prognosis  given his advanced age, Parkinson's dementia and likely new diagnosis of stage IV lung cancer.   Dispo: The patient is from: Home              Anticipated d/c is to: To be determined              Anticipated d/c date is: > 3 days              Patient currently is not medically stable to d/c.  With extremely grim prognosis   Consultants:   Cardiology, Dr. Ubaldo Glassing  Medical oncology  Interventional radiology  Palliative care  Procedures:   None  Antimicrobials:   Azithromycin 7/25>>  Ceftriaxone 7/25>>   Subjective: Patient seen and examined at bedside.  Son and nursing present.  Patient is more alert this morning, but seems to have difficulty understanding situation at hand.  Updated patient's son extensively, that thoracentesis was held yesterday due to his instability.  He is extremely ill with extremely poor prognosis which was relayed to him once again this morning.  He wishes to continue aggressive measures but did agree to DNR status this am.  Unable to obtain any further ROS due to patient's current mental status.  Overnight, patient's heart rate labile elevated up to 160 secondary to A. fib with RVR.  Patient did not receive his oral medications yesterday due to his mental status, hopefully he will be able to receive them today.  No other acute concerns per nursing staff at this time.  Objective: Vitals:   05/08/20 0617 05/08/20 0718 05/08/20 1033 05/08/20 1045  BP: (!) 130/94 125/79 (!) 100/57 95/75  Pulse: (!) 160 103 101   Resp: (!) 24 20    Temp: (!) 97.5 F (36.4 C)  97.7 F (36.5 C)    TempSrc: Oral Oral    SpO2: 96% 91% 96%   Weight:      Height:        Intake/Output Summary (Last 24 hours) at 05/08/2020 1049 Last data filed at 05/08/2020 0600 Gross per 24 hour  Intake 560.03 ml  Output 200 ml  Net 360.03 ml   Filed Weights   05/06/20 1620 05/07/20 1530 05/08/20 0451  Weight: 90.7 kg 72.1 kg 71 kg    Examination:  General exam: Extremely ill in  appearance, confused, more alert today Respiratory system: Coarse breath sounds bilaterally with crackles, slightly increased respiratory effort, on 6 L nasal cannula saturating 96%. Cardiovascular system: S1 & S2 heard, irregularly irregular rhythm, tachycardic. No JVD, murmurs, rubs, gallops or clicks. No pedal edema. Gastrointestinal system: Abdomen is nondistended, soft and nontender. No organomegaly or masses felt. Normal bowel sounds heard. Central nervous system: Confused, not oriented to person/place/time/situation Extremities: Moves all extremities independently Skin: No rashes, lesions or ulcers Psychiatry: Altered/confused    Data Reviewed: I have personally reviewed following labs and imaging studies  CBC: Recent Labs  Lab 05/06/20 1638 05/08/20 0734  WBC 13.5* 14.9*  HGB 11.9* 12.5*  HCT 34.6* 38.6*  MCV 89.4 94.1  PLT 268 409   Basic Metabolic Panel: Recent Labs  Lab 05/06/20 1638 05/08/20 0734  NA 138 140  K 4.5 4.5  CL 104 104  CO2 22 25  GLUCOSE 102* 79  BUN 31* 29*  CREATININE 1.87* 1.83*  CALCIUM 9.2 8.8*  MG  --  1.9   GFR: Estimated Creatinine Clearance: 26.9 mL/min (A) (by C-G formula based on SCr of 1.83 mg/dL (H)). Liver Function Tests: Recent Labs  Lab 05/06/20 1638 05/08/20 0734  AST 31 28  ALT 6 21  ALKPHOS 78 80  BILITOT 1.1 0.9  PROT 6.3* 6.3*  ALBUMIN 2.9* 2.7*   No results for input(s): LIPASE, AMYLASE in the last 168 hours. No results for input(s): AMMONIA in the last 168 hours. Coagulation Profile: No results for input(s): INR, PROTIME in the last 168 hours. Cardiac Enzymes: No results for input(s): CKTOTAL, CKMB, CKMBINDEX, TROPONINI in the last 168 hours. BNP (last 3 results) No results for input(s): PROBNP in the last 8760 hours. HbA1C: No results for input(s): HGBA1C in the last 72 hours. CBG: No results for input(s): GLUCAP in the last 168 hours. Lipid Profile: No results for input(s): CHOL, HDL, LDLCALC, TRIG,  CHOLHDL, LDLDIRECT in the last 72 hours. Thyroid Function Tests: No results for input(s): TSH, T4TOTAL, FREET4, T3FREE, THYROIDAB in the last 72 hours. Anemia Panel: No results for input(s): VITAMINB12, FOLATE, FERRITIN, TIBC, IRON, RETICCTPCT in the last 72 hours. Sepsis Labs: Recent Labs  Lab 05/06/20 1730  LATICACIDVEN 1.1    Recent Results (from the past 240 hour(s))  SARS Coronavirus 2 by RT PCR (hospital order, performed in [facility] hospital lab) Nasopharyngeal Nasopharyngeal Swab     Status: None   Collection Time: 05/06/20  4:38 PM   Specimen: Nasopharyngeal Swab  Result Value Ref Range Status   SARS Coronavirus 2 NEGATIVE NEGATIVE Final    Comment: (NOTE) SARS-CoV-2 target nucleic acids are NOT DETECTED.  The SARS-CoV-2 RNA is generally detectable in upper and lower respiratory specimens during the acute phase of infection. The lowest concentration of SARS-CoV-2 viral copies this assay can detect is 250 copies / mL. A negative result does not preclude SARS-CoV-2 infection and should not be used as the sole basis  for treatment or other patient management decisions.  A negative result may occur with improper specimen collection / handling, submission of specimen other than nasopharyngeal swab, presence of viral mutation(s) within the areas targeted by this assay, and inadequate number of viral copies (<250 copies / mL). A negative result must be combined with clinical observations, patient history, and epidemiological information.  Fact Sheet for Patients:   StrictlyIdeas.no  Fact Sheet for Healthcare Providers: BankingDealers.co.za  This test is not yet approved or  cleared by the [place] FDA and has been authorized for detection and/or diagnosis of SARS-CoV-2 by FDA under an Emergency Use Authorization (EUA).  This EUA will remain in effect (meaning this test can be used) for the duration of the COVID-19  declaration under Section 564(b)(1) of the Act, 21 U.S.C. section 360bbb-3(b)(1), unless the authorization is terminated or revoked sooner.  Performed at [facility], [address] [place], [place] [address]   Blood culture (routine x 2)     Status: None (Preliminary result)   Collection Time: 05/06/20  5:29 PM   Specimen: BLOOD  Result Value Ref Range Status   Specimen Description BLOOD RIGHT HAND  Final   Special Requests   Final    BOTTLES DRAWN AEROBIC AND ANAEROBIC Blood Culture adequate volume   Culture   Final    NO GROWTH 2 DAYS Performed at [facility], [address], [place], [place] [address]    Report Status PENDING  Incomplete  Blood culture (routine x 2)     Status: None (Preliminary result)   Collection Time: 05/06/20  5:29 PM   Specimen: BLOOD  Result Value Ref Range Status   Specimen Description BLOOD RIGHT FOREARM  Final   Special Requests   Final    BOTTLES DRAWN AEROBIC AND ANAEROBIC Blood Culture results may not be optimal due to an excessive volume of blood received in culture bottles   Culture   Final    NO GROWTH 2 DAYS Performed at [facility], [address] [place], [place] [address]    Report Status PENDING  Incomplete         Radiology Studies: DG Chest 1 View  Result Date: 05/06/2020 CLINICAL DATA:  86 year old male with history of shortness of breath and dyspnea. EXAM: CHEST  1 VIEW COMPARISON:  Chest x-ray 08/23/2014. FINDINGS: Multifocal airspace consolidation and interstitial prominence scattered asymmetrically throughout the lungs bilaterally, most confluent in the left upper lobe medially. Small left pleural effusion. No definite pneumothorax. Pulmonary vasculature is largely obscured. Heart size is normal. IMPRESSION: 1. Findings are concerning for severe multilobar bilateral pneumonia (left greater than right). The possibility of a centrally obstructing mass in the left upper lobe should be considered.  Further evaluation with contrast enhanced chest CT could better evaluate these findings if clinically appropriate. Electronically Signed   By: Vinnie Langton M.D.   On: 05/06/2020 17:03   CT Chest W Contrast  Result Date: 05/06/2020 CLINICAL DATA:  Chest pain, dyspnea, episodic apnea EXAM: CT CHEST WITH CONTRAST TECHNIQUE: Multidetector CT imaging of the chest was performed during intravenous contrast administration. CONTRAST:  76mL OMNIPAQUE IOHEXOL 300 MG/ML  SOLN COMPARISON:  None. FINDINGS: Cardiovascular: Global cardiac size within normal limits. Moderate calcification of the aortic valve leaflets. Extensive coronary artery calcification largely within the right coronary artery. No pericardial effusions. Moderate atherosclerotic calcification within the thoracic aorta without evidence of aneurysm. Central pulmonary arteries are of normal caliber. Mediastinum/Nodes: There is extensive pathologic hilar and  mediastinal adenopathy. Probable pathologic bilateral supraclavicular adenopathy. Index subcarinal lymph node measures 3.0 x 4.1 cm at axial image # 72/2 Lungs/Pleura: Moderate centrilobular emphysema. There is, superimposed, extensive multifocal airspace infiltrate within the left upper lobe, primarily, but scattered within the right lower lobe and right middle lobe in keeping with changes of multifocal pneumonia. However, there is, superimposed, a suspected mass within the left upper lobe more peripherally, poorly visualized due to adjacent infiltrate and peripherally collapsed left upper lobe, but roughly measuring 3.6 x 3.7 x 5.1 cm on coronal image # 51/5 and sagittal image # 121/6. There is an associated large left pleural effusion and it is unclear whether this represents a parapneumonic effusion or malignant effusion. Small right pleural effusion. There are innumerable scattered pulmonary nodules predominantly within the lung bases which are indeterminate, but are suspicious for intrapulmonary  metastases. Upper Abdomen: Multiple nodules are seen within the a drawing glands bilaterally which are not well characterized on this examination. There are multiple low-attenuation lesions seen within the liver suspicious for hepatic metastases given the additional findings. Musculoskeletal: Remote compression fracture of T11. No acute bone abnormality. No lytic or blastic bone lesion identified. IMPRESSION: Multifocal pulmonary infiltrates most in keeping with multifocal pneumonia in the acute setting. Large left and small right pleural effusions are noted. Left pleural effusion is indeterminate and may represent either a parapneumonic or malignant effusion. Focal mass within the left upper lobe with obliteration of the a bronchi and postobstructive collapse of the more peripheral left upper lobe, suspicious for a primary bronchogenic neoplasm. Suspected pathologic bilateral supraclavicular, mediastinal, and bilateral hilar adenopathy. Suspected hepatic metastases. Indeterminate adrenal nodules bilaterally. Repeat staging CT or PET-CT imaging could be performed once the patient's acute issues have resolved. Emphysema. Aortic Atherosclerosis (ICD10-I70.0) and Emphysema (ICD10-J43.9). Electronically Signed   By: Fidela Salisbury MD   On: 05/06/2020 18:46        Scheduled Meds: . carbidopa-levodopa  1 tablet Oral TID  . donepezil  5 mg Oral Daily  . DULoxetine  60 mg Oral Daily  . loratadine  10 mg Oral Daily  . metoprolol tartrate  25 mg Oral BID   Continuous Infusions: . azithromycin Stopped (05/08/20 0001)  . cefTRIAXone (ROCEPHIN)  IV Stopped (05/07/20 2247)  . magnesium sulfate bolus IVPB       LOS: 2 days    Time spent: 40 minutes spent on chart review, discussion with nursing staff, consultants, updating family and interview/physical exam; more than 50% of that time was spent in counseling and/or coordination of care.    Jordon Kristiansen J [place] DO [facility] Available via Epic secure  chat 7am-7pm After these hours, please refer to coverage provider listed on [email or web address] 05/08/2020, 10:49 AM

## 2020-05-08 NOTE — Progress Notes (Signed)
Family at bedside declining bed alarm. Educated on safety and agreeable to call for help if needed.

## 2020-05-08 NOTE — Progress Notes (Addendum)
[place]  [phone] [phone]   Name: Mike Smith Date: 05/08/2020 MRN: [id number]  DOB: September 08, 1928  Patient Care Team: Madelyn Brunner, MD as PCP - General (Internal Medicine)    REASON FOR CONSULTATION: Mike Smith is a 86 y.o. male with multiple medical problems including history of interstitial lung disease not on O2, Parkinson's disease, dementia, CKD 3, who was admitted to the hospital 05/07/2020 with hypoxic respiratory failure.  Patient was found to have multifocal pneumonia with an upper lobe lung mass, probable malignant pleural effusion, and hepatic metastasis on CT.  Patient was also found to have new onset A. fib with RVR.  He is not felt to likely be a candidate for aggressive cancer treatment.  Palliative care was consulted to help address goals..   CODE STATUS: DNR  PAST MEDICAL HISTORY: Past Medical History:  Diagnosis Date  . Atrial fibrillation ([place])   . Dementia ([place])     PAST SURGICAL HISTORY:  Past Surgical History:  Procedure Laterality Date  . SHOULDER SURGERY      HEMATOLOGY/ONCOLOGY HISTORY:  Oncology History   No history exists.    ALLERGIES:  has No Known Allergies.  MEDICATIONS:  Current Facility-Administered Medications  Medication Dose Route Frequency Provider Last Rate Last Admin  . azithromycin (ZITHROMAX) 500 mg in sodium chloride 0.9 % 250 mL IVPB  500 mg Intravenous Q24H Athena Masse, MD   Stopped at 05/08/20 0001  . carbidopa-levodopa (SINEMET IR) 25-100 MG per tablet immediate release 1 tablet  1 tablet Oral TID Athena Masse, MD   1 tablet at 05/08/20 0850  . cefTRIAXone (ROCEPHIN) 2 g in sodium chloride 0.9 % 100 mL IVPB  2 g Intravenous Q24H Athena Masse, MD   Stopped at 05/07/20 2247  . donepezil (ARICEPT) tablet 5 mg  5 mg Oral Daily Lu Duffel, RPH   5 mg at 05/08/20 0850  . DULoxetine (CYMBALTA) DR capsule 60  mg  60 mg Oral Daily Athena Masse, MD   60 mg at 05/08/20 0849  . feeding supplement (ENSURE ENLIVE) (ENSURE ENLIVE) liquid 237 mL  237 mL Oral TID BM [place] Eric J, DO      . haloperidol lactate (HALDOL) injection 2 mg  2 mg Intravenous Q6H PRN [place] Donnamarie Poag, DO   2 mg at 05/07/20 0916  . HYDROcodone-acetaminophen (NORCO/VICODIN) 5-325 MG per tablet 1 tablet  1 tablet Oral Q6H PRN Athena Masse, MD      . ipratropium-albuterol (DUONEB) 0.5-2.5 (3) MG/3ML nebulizer solution 3 mL  3 mL Nebulization Q6H PRN [place] Eric J, DO      . loratadine (CLARITIN) tablet 10 mg  10 mg Oral Daily Athena Masse, MD   10 mg at 05/08/20 0850  . metoprolol tartrate (LOPRESSOR) tablet 25 mg  25 mg Oral BID Athena Masse, MD   25 mg at 05/08/20 0847    VITAL SIGNS: BP 105/76 (BP Location: Left Arm)   Pulse 100   Temp 98 F (36.7 C) (Oral)   Resp 18   Ht '6\' 1"'  (1.854 m)   Wt 156 lb 8 oz (71 kg)   SpO2 93%   BMI 20.65 kg/m  Filed Weights   05/06/20 1620 05/07/20 1530 05/08/20 0451  Weight: 200 lb (90.7 kg) 159 lb (72.1 kg) 156 lb 8 oz (71 kg)    Estimated body mass index is 20.65 kg/m as calculated  from the following:   Height as of this encounter: '6\' 1"'  (1.854 m).   Weight as of this encounter: 156 lb 8 oz (71 kg).  LABS: CBC:    Component Value Date/Time   WBC 14.9 (H) 05/08/2020 0734   HGB 12.5 (L) 05/08/2020 0734   HGB 12.7 (L) 08/23/2014 2013   HCT 38.6 (L) 05/08/2020 0734   HCT 38.5 (L) 08/23/2014 2013   PLT 299 05/08/2020 0734   PLT 176 08/23/2014 2013   MCV 94.1 05/08/2020 0734   MCV 94 08/23/2014 2013   NEUTROABS 4.5 08/23/2014 2013   LYMPHSABS 1.2 08/23/2014 2013   MONOABS 0.8 08/23/2014 2013   EOSABS 0.3 08/23/2014 2013   BASOSABS 0.0 08/23/2014 2013   Comprehensive Metabolic Panel:    Component Value Date/Time   NA 140 05/08/2020 0734   NA 140 08/23/2014 2013   K 4.5 05/08/2020 0734   K 4.5 08/23/2014 2013   CL 104 05/08/2020 0734   CL 106 08/23/2014 2013     CO2 25 05/08/2020 0734   CO2 28 08/23/2014 2013   BUN 29 (H) 05/08/2020 0734   BUN 18 08/23/2014 2013   CREATININE 1.83 (H) 05/08/2020 0734   CREATININE 1.71 (H) 08/23/2014 2013   GLUCOSE 79 05/08/2020 0734   GLUCOSE 97 08/23/2014 2013   CALCIUM 8.8 (L) 05/08/2020 0734   CALCIUM 8.1 (L) 08/23/2014 2013   AST 28 05/08/2020 0734   AST 24 06/14/2013 1133   ALT 21 05/08/2020 0734   ALT 21 06/14/2013 1133   ALKPHOS 80 05/08/2020 0734   ALKPHOS 94 06/14/2013 1133   BILITOT 0.9 05/08/2020 0734   BILITOT 0.5 06/14/2013 1133   PROT 6.3 (L) 05/08/2020 0734   PROT 7.0 06/14/2013 1133   ALBUMIN 2.7 (L) 05/08/2020 0734   ALBUMIN 3.7 06/14/2013 1133    RADIOGRAPHIC STUDIES: DG Chest 1 View  Result Date: 05/06/2020 CLINICAL DATA:  86 year old male with history of shortness of breath and dyspnea. EXAM: CHEST  1 VIEW COMPARISON:  Chest x-ray 08/23/2014. FINDINGS: Multifocal airspace consolidation and interstitial prominence scattered asymmetrically throughout the lungs bilaterally, most confluent in the left upper lobe medially. Small left pleural effusion. No definite pneumothorax. Pulmonary vasculature is largely obscured. Heart size is normal. IMPRESSION: 1. Findings are concerning for severe multilobar bilateral pneumonia (left greater than right). The possibility of a centrally obstructing mass in the left upper lobe should be considered. Further evaluation with contrast enhanced chest CT could better evaluate these findings if clinically appropriate. Electronically Signed   By: Vinnie Langton M.D.   On: 05/06/2020 17:03   CT Chest W Contrast  Result Date: 05/06/2020 CLINICAL DATA:  Chest pain, dyspnea, episodic apnea EXAM: CT CHEST WITH CONTRAST TECHNIQUE: Multidetector CT imaging of the chest was performed during intravenous contrast administration. CONTRAST:  56m OMNIPAQUE IOHEXOL 300 MG/ML  SOLN COMPARISON:  None. FINDINGS: Cardiovascular: Global cardiac size within normal limits.  Moderate calcification of the aortic valve leaflets. Extensive coronary artery calcification largely within the right coronary artery. No pericardial effusions. Moderate atherosclerotic calcification within the thoracic aorta without evidence of aneurysm. Central pulmonary arteries are of normal caliber. Mediastinum/Nodes: There is extensive pathologic hilar and mediastinal adenopathy. Probable pathologic bilateral supraclavicular adenopathy. Index subcarinal lymph node measures 3.0 x 4.1 cm at axial image # 72/2 Lungs/Pleura: Moderate centrilobular emphysema. There is, superimposed, extensive multifocal airspace infiltrate within the left upper lobe, primarily, but scattered within the right lower lobe and right middle lobe in keeping with changes of  multifocal pneumonia. However, there is, superimposed, a suspected mass within the left upper lobe more peripherally, poorly visualized due to adjacent infiltrate and peripherally collapsed left upper lobe, but roughly measuring 3.6 x 3.7 x 5.1 cm on coronal image # 51/5 and sagittal image # 121/6. There is an associated large left pleural effusion and it is unclear whether this represents a parapneumonic effusion or malignant effusion. Small right pleural effusion. There are innumerable scattered pulmonary nodules predominantly within the lung bases which are indeterminate, but are suspicious for intrapulmonary metastases. Upper Abdomen: Multiple nodules are seen within the a drawing glands bilaterally which are not well characterized on this examination. There are multiple low-attenuation lesions seen within the liver suspicious for hepatic metastases given the additional findings. Musculoskeletal: Remote compression fracture of T11. No acute bone abnormality. No lytic or blastic bone lesion identified. IMPRESSION: Multifocal pulmonary infiltrates most in keeping with multifocal pneumonia in the acute setting. Large left and small right pleural effusions are noted.  Left pleural effusion is indeterminate and may represent either a parapneumonic or malignant effusion. Focal mass within the left upper lobe with obliteration of the a bronchi and postobstructive collapse of the more peripheral left upper lobe, suspicious for a primary bronchogenic neoplasm. Suspected pathologic bilateral supraclavicular, mediastinal, and bilateral hilar adenopathy. Suspected hepatic metastases. Indeterminate adrenal nodules bilaterally. Repeat staging CT or PET-CT imaging could be performed once the patient's acute issues have resolved. Emphysema. Aortic Atherosclerosis (ICD10-I70.0) and Emphysema (ICD10-J43.9). Electronically Signed   By: Fidela Salisbury MD   On: 05/06/2020 18:46   DG Chest Port 1 View  Result Date: 05/08/2020 CLINICAL DATA:  Status post thoracentesis EXAM: PORTABLE CHEST 1 VIEW COMPARISON:  May 06, 2020 chest radiograph and chest CT. FINDINGS: No appreciable pneumothorax. Left pleural effusion smaller after thoracentesis. There is there is again noted multifocal airspace opacity bilaterally, similar to 2 days prior. Heart is upper normal in size with pulmonary vascularity within normal limits. No adenopathy evident. Degenerative changes noted in the right shoulder with superior migration of the right humeral head. Note colonic interposition between the right hemidiaphragm and liver. IMPRESSION: No appreciable pneumothorax. Multifocal airspace opacity persists, likely due to multifocal pneumonia. Stable cardiac silhouette. Probable chronic rotator cuff tear on the right with superior migration of the right humeral head. Electronically Signed   By: Lowella Grip III M.D.   On: 05/08/2020 11:10   US THORACENTESIS ASP PLEURAL SPACE W/IMG GUIDE  Result Date: 05/08/2020 INDICATION: Patient with history of interstitial lung disease, CKD 3 - currently admitted multifocal pneumonia and large left pleural effusion on CT, request to IR for diagnostic and therapeutic  thoracentesis. EXAM: ULTRASOUND GUIDED LEFT THORACENTESIS MEDICATIONS: 7 mL 1% lidocaine COMPLICATIONS: None immediate. PROCEDURE: An ultrasound guided thoracentesis was thoroughly discussed with the patient and questions answered. The benefits, risks, alternatives and complications were also discussed. The patient understands and wishes to proceed with the procedure. Written consent was obtained. Ultrasound was performed to localize and mark an adequate pocket of fluid in the left chest. The area was then prepped and draped in the normal sterile fashion. 1% Lidocaine was used for local anesthesia. Under ultrasound guidance a 6 Fr Safe-T-Centesis catheter was introduced. Thoracentesis was performed. The catheter was removed and a dressing applied. FINDINGS: A total of approximately 1.4 L of hazy yellow fluid was removed. Samples were sent to the laboratory as requested by the clinical team. IMPRESSION: Successful ultrasound guided left thoracentesis yielding 1.4 L of pleural fluid. Read by Candiss Norse,  PA-C Electronically Signed   By: Sandi Mariscal M.D.   On: 05/08/2020 10:52    PERFORMANCE STATUS (ECOG) : 3 - Symptomatic, >50% confined to bed  Review of Systems Unless otherwise noted, a complete review of systems is negative.  Physical Exam General: NAD Cardiovascular: irregular Pulmonary: coarse ant fields Abdomen: soft, nontender, + bowel sounds GU: no suprapubic tenderness Extremities: no edema, no joint deformities Skin: no rashes Neurological: Weakness, some confusion but otherwise nonfocal  IMPRESSION: Patient is more responsive this morning. He is confused but able to answer some questions. Son was feeding him breakfast. Patient endorses exertional dyspnea. He is pending thoracentesis later today.  Family changed CODE STATUS to DNR overnight. Agree with this decision.  Met with son who is at bedside did briefly discuss overall goals. They are in agreement with continued supportive  care and treatment of the pneumonia. Son is hopeful that patient will be able to receive a thoracentesis today.  Patient would probably benefit from hospice involvement if there is no plan for further cancer work-up/treatment. I did briefly discuss the option of hospice with family yesterday.  PLAN: -Continue current scope of treatment -Agree with DNR/DNI -We will follow  Case and plan discussed with Drs. Brahmanday and [place]   Time Total: 20 minutes  Visit consisted of counseling and education dealing with the complex and emotionally intense issues of symptom management and palliative care in the setting of serious and potentially life-threatening illness.Greater than 50%  of this time was spent counseling and coordinating care related to the above assessment and plan.  Signed by: Altha Harm, PhD, NP-C

## 2020-05-08 NOTE — Procedures (Signed)
PROCEDURE SUMMARY:  Successful image-guided left thoracentesis. Yielded 1.4 liters of hazy yellow fluid. Procedure aborted at this amount due to worsening hypotension and pain with positioning - small amount of residual fluid remains on post procedure [place] which should not require repeat thoracentesis unless further fluid accumulates. Patient tolerated procedure well. EBL: Zero No immediate complications.  Specimen was sent for labs. Post procedure CXR shows no pneumothorax.  Please see imaging section of Epic for full dictation.  Joaquim Nam PA-C 05/08/2020 10:49 AM

## 2020-05-08 NOTE — Progress Notes (Addendum)
Initial Nutrition Assessment  DOCUMENTATION CODES:   Severe malnutrition in context of chronic illness  INTERVENTION:   Ensure Enlive po TID, each supplement provides 350 kcal and 20 grams of protein  Magic cup TID with meals, each supplement provides 290 kcal and 9 grams of protein  NUTRITION DIAGNOSIS:   Severe Malnutrition related to cancer and cancer related treatments as evidenced by severe fat depletion, severe muscle depletion, weight loss 22% over the past year.  GOAL:   Patient will meet greater than or equal to 90% of their needs  MONITOR:   PO intake, Supplement acceptance, Labs, Weight trends, Skin, I & O's  REASON FOR ASSESSMENT:   Malnutrition Screening Tool    ASSESSMENT:   86 y.o. male with multiple medical problems including history of interstitial lung disease not on O2, Parkinson's disease, dementia, CKD 3, who was admitted to the hospital 05/07/2020 with hypoxic respiratory failure.  Patient was found to have multifocal pneumonia with an upper lobe lung mass, probable malignant pleural effusion, and hepatic metastasis on CT.   Pt sleeping at time of RD visit; RD did not wake patient as pt would not likely be able to provide much history r/t dementia. Per chart review, pt ate 100% of his breakfast this morning and 75% of his lunch. RD will add supplements to help pt meet his estimated needs. Per chart, pt down 44lbs(22%) over the past year; this is significant.    Medications reviewed and include: azithromycin, ceftriaxone  Labs reviewed: BUN 29(H), creat 1.83(H) Wbc- 14.9(H)  NUTRITION - FOCUSED PHYSICAL EXAM:    Most Recent Value  Orbital Region Severe depletion  Upper Arm Region Severe depletion  Thoracic and Lumbar Region Severe depletion  Buccal Region Severe depletion  Temple Region Severe depletion  Clavicle Bone Region Severe depletion  Clavicle and Acromion Bone Region Severe depletion  Scapular Bone Region Severe depletion  Dorsal Hand  Severe depletion  Patellar Region Severe depletion  Anterior Thigh Region Severe depletion  Posterior Calf Region Severe depletion  Edema (RD Assessment) None  Hair Reviewed  Eyes Reviewed  Mouth Reviewed  Skin Reviewed  Nails Reviewed     Diet Order:   Diet Order            DIET DYS 3 Room service appropriate? Yes; Fluid consistency: Thin  Diet effective now                EDUCATION NEEDS:   No education needs have been identified at this time  Skin:  Skin Assessment: Reviewed RN Assessment (Stage II buttocks and hip)  Last BM:  7/24  Height:   Ht Readings from Last 1 Encounters:  05/07/20 6\' 1"  (1.854 m)    Weight:   Wt Readings from Last 1 Encounters:  05/08/20 71 kg    Ideal Body Weight:  83.6 kg  BMI:  Body mass index is 20.65 kg/m.  Estimated Nutritional Needs:   Kcal:  1900-2200kcal/day  Protein:  95-110g/day  Fluid:  >1.8L/day  Koleen Distance MS, RD, LDN Please refer to [facility] for RD and/or RD on-call/weekend/after hours pager

## 2020-05-08 NOTE — Progress Notes (Signed)
   05/08/20 0617  Assess: MEWS Score  Temp (!) 97.5 F (36.4 C)  BP (!) 130/94  Pulse Rate (!) 160  Resp (!) 24  SpO2 96 %  O2 Device Nasal Cannula  O2 Flow Rate (L/min) 6 L/min  Assess: MEWS Score  MEWS Temp 0  MEWS Systolic 0  MEWS Pulse 3  MEWS RR 1  MEWS LOC 0  MEWS Score 4  MEWS Score Color Red  Assess: if the MEWS score is Yellow or Red  Were vital signs taken at a resting state? Yes  Focused Assessment Change from prior assessment (see assessment flowsheet)  Early Detection of Sepsis Score *See Row Information* Medium  MEWS guidelines implemented *See Row Information* No, previously red, continue vital signs every 4 hours  Treat  MEWS Interventions Escalated (See documentation below)  Escalate  MEWS: Escalate Red: discuss with charge nurse/RN and provider, consider discussing with RRT  Notify: Charge Nurse/RN  Name of Charge Nurse/RN Notified Adrienne  Date Charge Nurse/RN Notified 05/08/20  Time Charge Nurse/RN Notified [id number]  Notify: Provider  Provider Name/Title Ouma NP  Date Provider Notified 05/08/20  Time Provider Notified [phone]  Notification Type Page  Notification Reason Change in status  Response No new orders  Date of Provider Response 05/08/20  Time of Provider Response [phone]  Document  Patient Outcome Not stable and remains on department  Progress note created (see row info) Yes

## 2020-05-08 NOTE — Progress Notes (Signed)
Mike Smith   DOB:1928-02-06   FA#:[id number]    Subjective: Patient resting in the bed.  Patient is drowsy.  Unable to obtain history.  Patient is currently on 6 L of oxygen.  As per the son no acute events overnight.  Patient had thoracentesis this morning-mostly uneventful.  Objective:  Vitals:   05/08/20 1521 05/08/20 1926  BP: 99/74 110/71  Pulse: 100 93  Resp: 19 18  Temp: 98.1 F (36.7 C) 97.8 F (36.6 C)  SpO2: 92%      Intake/Output Summary (Last 24 hours) at 05/08/2020 2132 Last data filed at 05/08/2020 1926 Gross per 24 hour  Intake 973.95 ml  Output 300 ml  Net 673.95 ml    Physical Exam Constitutional:      Comments: Cachectic appearing elderly male patient resting comfortably in the bed.  Accompanied by his son.  Drowsy; not following verbal commands.  HENT:     Head: Normocephalic and atraumatic.     Mouth/Throat:     Pharynx: No oropharyngeal exudate.  Eyes:     Pupils: Pupils are equal, round, and reactive to light.  Cardiovascular:     Comments: Tachycardic irregular regular rhythm. Pulmonary:     Effort: No respiratory distress.     Breath sounds: No wheezing.     Comments: Decreased breath sounds left more than right. Abdominal:     General: Bowel sounds are normal. There is no distension.     Palpations: Abdomen is soft. There is no mass.     Tenderness: There is no abdominal tenderness. There is no guarding or rebound.  Musculoskeletal:        General: No tenderness. Normal range of motion.     Cervical back: Normal range of motion and neck supple.  Skin:    General: Skin is warm.  Neurological:     Comments: Drowsy.  Psychiatric:        Mood and Affect: Affect normal.    Labs:  Lab Results  Component Value Date   WBC 14.9 (H) 05/08/2020   HGB 12.5 (L) 05/08/2020   HCT 38.6 (L) 05/08/2020   MCV 94.1 05/08/2020   PLT 299 05/08/2020   NEUTROABS 4.5 08/23/2014    Lab Results  Component Value Date   NA 140 05/08/2020    K 4.5 05/08/2020   CL 104 05/08/2020   CO2 25 05/08/2020    Studies:  DG Chest Port 1 View  Result Date: 05/08/2020 CLINICAL DATA:  Status post thoracentesis EXAM: PORTABLE CHEST 1 VIEW COMPARISON:  May 06, 2020 chest radiograph and chest CT. FINDINGS: No appreciable pneumothorax. Left pleural effusion smaller after thoracentesis. There is there is again noted multifocal airspace opacity bilaterally, similar to 2 days prior. Heart is upper normal in size with pulmonary vascularity within normal limits. No adenopathy evident. Degenerative changes noted in the right shoulder with superior migration of the right humeral head. Note colonic interposition between the right hemidiaphragm and liver. IMPRESSION: No appreciable pneumothorax. Multifocal airspace opacity persists, likely due to multifocal pneumonia. Stable cardiac silhouette. Probable chronic rotator cuff tear on the right with superior migration of the right humeral head. Electronically Signed   By: Lowella Grip III M.D.   On: 05/08/2020 11:10   US THORACENTESIS ASP PLEURAL SPACE W/IMG GUIDE  Result Date: 05/08/2020 INDICATION: Patient with history of interstitial lung disease, CKD 3 - currently admitted multifocal pneumonia and large left pleural effusion on CT, request to IR for diagnostic and therapeutic thoracentesis. EXAM: ULTRASOUND  GUIDED LEFT THORACENTESIS MEDICATIONS: 7 mL 1% lidocaine COMPLICATIONS: None immediate. PROCEDURE: An ultrasound guided thoracentesis was thoroughly discussed with the patient and questions answered. The benefits, risks, alternatives and complications were also discussed. The patient understands and wishes to proceed with the procedure. Written consent was obtained. Ultrasound was performed to localize and mark an adequate pocket of fluid in the left chest. The area was then prepped and draped in the normal sterile fashion. 1% Lidocaine was used for local anesthesia. Under ultrasound guidance a 6 Fr  Safe-T-Centesis catheter was introduced. Thoracentesis was performed. The catheter was removed and a dressing applied. FINDINGS: A total of approximately 1.4 L of hazy yellow fluid was removed. Samples were sent to the laboratory as requested by the clinical team. IMPRESSION: Successful ultrasound guided left thoracentesis yielding 1.4 L of pleural fluid. Read by Candiss Norse, PA-C Electronically Signed   By: Sandi Mariscal M.D.   On: 05/08/2020 10:52    Mass of upper lobe of left lung #86 year old male patient with multiple medical problems including Parkinson's disease dementia; chronic kidney disease; COPD/long-term history of smoking-currently admitted to hospital for worsening shortness of breath cough  #Acute hypoxic respiratory failure -6 L of oxygen-CT scan left upper lobe mass/moderate left pleural effusion; bilateral pneumonia-currently on IV antibiotics.  Status post antibiotics.  #Left lung mass/large pleural effusion-status post thoracentesis-mostly for therapeutic reasons.  Awake cytology.  Discussed with the son that is quite possible that diagnostic paracentesis could be inconclusive.  However I would not recommend additional biopsy-Unfortunately that would not change course of management.  #Newly diagnosed A. fib with RVR-currently rate controlled.  #Mental status changes-metabolic encephalopathy [Ativan/underlying pneumonia] vs-question malignancy of the brain-stable.  Prognosis: Patient is DNR/DNI.  Again reviewed at length with the patient's son by the bedside.  For now continue current scope of care-as family is hopeful of improvement of pneumonia.  However as reviewed above would not recommend any further diagnostic work-up for malignancy.  Patient son asked multiple but very appropriate questions-which were answered to the best of my knowledge.  Discussed with [employer].  Discussed with Dr. [place]     Cammie Sickle, MD 05/08/2020  9:32 PM

## 2020-05-09 DIAGNOSIS — R778 Other specified abnormalities of plasma proteins: Secondary | ICD-10-CM

## 2020-05-09 DIAGNOSIS — E43 Unspecified severe protein-calorie malnutrition: Secondary | ICD-10-CM | POA: Insufficient documentation

## 2020-05-09 DIAGNOSIS — C3412 Malignant neoplasm of upper lobe, left bronchus or lung: Secondary | ICD-10-CM

## 2020-05-09 DIAGNOSIS — Z515 Encounter for palliative care: Secondary | ICD-10-CM | POA: Diagnosis not present

## 2020-05-09 DIAGNOSIS — J9601 Acute respiratory failure with hypoxia: Secondary | ICD-10-CM | POA: Diagnosis not present

## 2020-05-09 DIAGNOSIS — R918 Other nonspecific abnormal finding of lung field: Secondary | ICD-10-CM

## 2020-05-09 LAB — CBC
HCT: 38.2 % — ABNORMAL LOW (ref 39.0–52.0)
Hemoglobin: 12.2 g/dL — ABNORMAL LOW (ref 13.0–17.0)
MCH: 29.8 pg (ref 26.0–34.0)
MCHC: 31.9 g/dL (ref 30.0–36.0)
MCV: 93.2 fL (ref 80.0–100.0)
Platelets: 284 10*3/uL (ref 150–400)
RBC: 4.1 MIL/uL — ABNORMAL LOW (ref 4.22–5.81)
RDW: 15.5 % (ref 11.5–15.5)
WBC: 15.4 10*3/uL — ABNORMAL HIGH (ref 4.0–10.5)
nRBC: 0 % (ref 0.0–0.2)

## 2020-05-09 LAB — COMPREHENSIVE METABOLIC PANEL
ALT: 9 U/L (ref 0–44)
AST: 25 U/L (ref 15–41)
Albumin: 2.6 g/dL — ABNORMAL LOW (ref 3.5–5.0)
Alkaline Phosphatase: 74 U/L (ref 38–126)
Anion gap: 9 (ref 5–15)
BUN: 30 mg/dL — ABNORMAL HIGH (ref 8–23)
CO2: 27 mmol/L (ref 22–32)
Calcium: 8.6 mg/dL — ABNORMAL LOW (ref 8.9–10.3)
Chloride: 105 mmol/L (ref 98–111)
Creatinine, Ser: 1.6 mg/dL — ABNORMAL HIGH (ref 0.61–1.24)
GFR calc Af Amer: 43 mL/min — ABNORMAL LOW (ref >60)
GFR calc non Af Amer: 37 mL/min — ABNORMAL LOW (ref >60)
Glucose, Bld: 100 mg/dL — ABNORMAL HIGH (ref 70–99)
Potassium: 4.5 mmol/L (ref 3.5–5.1)
Sodium: 141 mmol/L (ref 135–145)
Total Bilirubin: 0.7 mg/dL (ref 0.3–1.2)
Total Protein: 5.8 g/dL — ABNORMAL LOW (ref 6.5–8.1)

## 2020-05-09 LAB — MAGNESIUM: Magnesium: 2.2 mg/dL (ref 1.7–2.4)

## 2020-05-09 LAB — PH, BODY FLUID: pH, Body Fluid: 7.4

## 2020-05-09 MED ORDER — HALOPERIDOL LACTATE 5 MG/ML IJ SOLN
1.0000 mg | Freq: Once | INTRAMUSCULAR | Status: AC
  Administered 2020-05-09: 1 mg via INTRAVENOUS
  Filled 2020-05-09: qty 1

## 2020-05-09 MED ORDER — HALOPERIDOL LACTATE 5 MG/ML IJ SOLN
2.0000 mg | INTRAMUSCULAR | Status: DC | PRN
  Administered 2020-05-09 – 2020-05-10 (×2): 2 mg via INTRAVENOUS
  Filled 2020-05-09 (×2): qty 1

## 2020-05-09 MED ORDER — DILTIAZEM HCL 30 MG PO TABS
90.0000 mg | ORAL_TABLET | Freq: Three times a day (TID) | ORAL | Status: DC
  Administered 2020-05-09 – 2020-05-10 (×2): 90 mg via ORAL
  Filled 2020-05-09 (×3): qty 3

## 2020-05-09 MED ORDER — LORAZEPAM 2 MG/ML IJ SOLN
0.5000 mg | Freq: Once | INTRAMUSCULAR | Status: AC
  Administered 2020-05-09: 0.5 mg via INTRAVENOUS
  Filled 2020-05-09: qty 1

## 2020-05-09 MED ORDER — ENOXAPARIN SODIUM 40 MG/0.4ML ~~LOC~~ SOLN
40.0000 mg | SUBCUTANEOUS | Status: DC
  Administered 2020-05-09 – 2020-05-12 (×4): 40 mg via SUBCUTANEOUS
  Filled 2020-05-09 (×4): qty 0.4

## 2020-05-09 MED ORDER — SODIUM CHLORIDE 0.9% FLUSH
10.0000 mL | Freq: Two times a day (BID) | INTRAVENOUS | Status: DC
  Administered 2020-05-09 – 2020-05-12 (×4): 10 mL via INTRAVENOUS

## 2020-05-09 MED ORDER — SODIUM CHLORIDE 0.9 % IV SOLN
INTRAVENOUS | Status: DC | PRN
  Administered 2020-05-09: 250 mL via INTRAVENOUS
  Administered 2020-05-11 – 2020-05-12 (×3): 10 mL via INTRAVENOUS

## 2020-05-09 MED ORDER — QUETIAPINE FUMARATE 25 MG PO TABS
25.0000 mg | ORAL_TABLET | Freq: Every day | ORAL | Status: DC
  Administered 2020-05-09 – 2020-05-14 (×5): 25 mg via ORAL
  Filled 2020-05-09 (×6): qty 1

## 2020-05-09 NOTE — Progress Notes (Signed)
Patient Name: Mike Smith Date of Encounter: 05/09/2020  Hospital Problem List     Principal Problem:   Acute respiratory failure with hypoxia [facility]) Active Problems:   Multifocal pneumonia   Mass of upper lobe of left lung   Elevated troponin   Benign essential hypertension   CRF (chronic renal failure), stage 3 (moderate)   ILD (interstitial lung disease) (HCC)   Parkinson disease (HCC)   Pleural effusion   Hepatic metastases on CT [facility])   New onset atrial fibrillation (HCC)   Palliative care encounter   Pressure injury of skin   Protein-calorie malnutrition, severe    Patient Profile      86 y.o. male with history of Parkinson's disease, dementia, interstitial lung disease not treated with oxygen, hypertension, chronic kidney disease grade 3 who was sent to the ER by his primary care physician after being noted to have oxygen saturations of 86 on room air.  Patient not able to give history.  He apparently had had decreased appetite and weakness.  No chest pain.  However troponins were drawn and noted to be 204 and 222.  BNP was 232.  His baseline creatinine is 1.7 and his creatinine which was 1.87 in the emergency room.  Chest CT showed multifocal pneumonia and large left and small right pleural effusions.  He had a left upper lobe focal mass with postobstructive collapse along with mediastinal and bilateral hilar adenopathy.  Patient was started on empiric antibiotics.  Consultation requested for abnormal troponin.  EKG revealed atrial fibrillation with no ischemia.  Subjective   Very agitated. Heart rate increased. Not able to give history  Inpatient Medications    . carbidopa-levodopa  1 tablet Oral TID  . donepezil  5 mg Oral Daily  . DULoxetine  60 mg Oral Daily  . feeding supplement (ENSURE ENLIVE)  237 mL Oral TID BM  . haloperidol lactate  1 mg Intravenous Once  . loratadine  10 mg Oral Daily  . metoprolol tartrate  25 mg Oral BID    Vital Signs     Vitals:   05/09/20 0457 05/09/20 0610 05/09/20 0623 05/09/20 0721  BP: (!) 121/95 115/77 (!) 134/74 (!) 136/87  Pulse: 94 92 (!) 110 (!) 174  Resp:    17  Temp:      TempSrc:      SpO2:    98%  Weight:   71 kg   Height:        Intake/Output Summary (Last 24 hours) at 05/09/2020 0734 Last data filed at 05/09/2020 0200 Gross per 24 hour  Intake 624 ml  Output 550 ml  Net 74 ml   Filed Weights   05/07/20 1530 05/08/20 0451 05/09/20 0623  Weight: 72.1 kg 71 kg 71 kg    Physical Exam    GEN: Well nourished, well developed, in no acute distress.  HEENT: normal.  Neck: Supple, no JVD, carotid bruits, or masses. Cardiac: irr, irr, tachycardic Respiratory:  Respirations regular and unlabored, clear to auscultation bilaterally. GI: Soft, nontender, nondistended, BS + x 4. MS: no deformity or atrophy. Skin: warm and dry, no rash. Neuro:  Confused and agitated.  Labs    CBC Recent Labs    05/08/20 0734 05/09/20 0331  WBC 14.9* 15.4*  HGB 12.5* 12.2*  HCT 38.6* 38.2*  MCV 94.1 93.2  PLT 299 301   Basic Metabolic Panel Recent Labs    05/08/20 0734 05/09/20 0331  NA 140 141  K 4.5 4.5  CL 104 105  CO2 25 27  GLUCOSE 79 100*  BUN 29* 30*  CREATININE 1.83* 1.60*  CALCIUM 8.8* 8.6*  MG 1.9 2.2   Liver Function Tests Recent Labs    05/08/20 0734 05/09/20 0331  AST 28 25  ALT 21 9  ALKPHOS 80 74  BILITOT 0.9 0.7  PROT 6.3* 5.8*  ALBUMIN 2.7* 2.6*   No results for input(s): LIPASE, AMYLASE in the last 72 hours. Cardiac Enzymes No results for input(s): CKTOTAL, CKMB, CKMBINDEX, TROPONINI in the last 72 hours. BNP Recent Labs    05/06/20 1638  BNP 232.1*   D-Dimer No results for input(s): DDIMER in the last 72 hours. Hemoglobin A1C No results for input(s): HGBA1C in the last 72 hours. Fasting Lipid Panel No results for input(s): CHOL, HDL, LDLCALC, TRIG, CHOLHDL, LDLDIRECT in the last 72 hours. Thyroid Function Tests No results for input(s): TSH,  T4TOTAL, T3FREE, THYROIDAB in the last 72 hours.  Invalid input(s): FREET3  Telemetry    afib with rvr  ECG    afib with rvr  Radiology    DG Chest 1 View  Result Date: 05/06/2020 CLINICAL DATA:  86 year old male with history of shortness of breath and dyspnea. EXAM: CHEST  1 VIEW COMPARISON:  Chest x-ray 08/23/2014. FINDINGS: Multifocal airspace consolidation and interstitial prominence scattered asymmetrically throughout the lungs bilaterally, most confluent in the left upper lobe medially. Small left pleural effusion. No definite pneumothorax. Pulmonary vasculature is largely obscured. Heart size is normal. IMPRESSION: 1. Findings are concerning for severe multilobar bilateral pneumonia (left greater than right). The possibility of a centrally obstructing mass in the left upper lobe should be considered. Further evaluation with contrast enhanced chest CT could better evaluate these findings if clinically appropriate. Electronically Signed   By: Vinnie Langton M.D.   On: 05/06/2020 17:03   CT Chest W Contrast  Result Date: 05/06/2020 CLINICAL DATA:  Chest pain, dyspnea, episodic apnea EXAM: CT CHEST WITH CONTRAST TECHNIQUE: Multidetector CT imaging of the chest was performed during intravenous contrast administration. CONTRAST:  70mL OMNIPAQUE IOHEXOL 300 MG/ML  SOLN COMPARISON:  None. FINDINGS: Cardiovascular: Global cardiac size within normal limits. Moderate calcification of the aortic valve leaflets. Extensive coronary artery calcification largely within the right coronary artery. No pericardial effusions. Moderate atherosclerotic calcification within the thoracic aorta without evidence of aneurysm. Central pulmonary arteries are of normal caliber. Mediastinum/Nodes: There is extensive pathologic hilar and mediastinal adenopathy. Probable pathologic bilateral supraclavicular adenopathy. Index subcarinal lymph node measures 3.0 x 4.1 cm at axial image # 72/2 Lungs/Pleura: Moderate  centrilobular emphysema. There is, superimposed, extensive multifocal airspace infiltrate within the left upper lobe, primarily, but scattered within the right lower lobe and right middle lobe in keeping with changes of multifocal pneumonia. However, there is, superimposed, a suspected mass within the left upper lobe more peripherally, poorly visualized due to adjacent infiltrate and peripherally collapsed left upper lobe, but roughly measuring 3.6 x 3.7 x 5.1 cm on coronal image # 51/5 and sagittal image # 121/6. There is an associated large left pleural effusion and it is unclear whether this represents a parapneumonic effusion or malignant effusion. Small right pleural effusion. There are innumerable scattered pulmonary nodules predominantly within the lung bases which are indeterminate, but are suspicious for intrapulmonary metastases. Upper Abdomen: Multiple nodules are seen within the a drawing glands bilaterally which are not well characterized on this examination. There are multiple low-attenuation lesions seen within the liver suspicious for hepatic metastases given the additional findings.  Musculoskeletal: Remote compression fracture of T11. No acute bone abnormality. No lytic or blastic bone lesion identified. IMPRESSION: Multifocal pulmonary infiltrates most in keeping with multifocal pneumonia in the acute setting. Large left and small right pleural effusions are noted. Left pleural effusion is indeterminate and may represent either a parapneumonic or malignant effusion. Focal mass within the left upper lobe with obliteration of the a bronchi and postobstructive collapse of the more peripheral left upper lobe, suspicious for a primary bronchogenic neoplasm. Suspected pathologic bilateral supraclavicular, mediastinal, and bilateral hilar adenopathy. Suspected hepatic metastases. Indeterminate adrenal nodules bilaterally. Repeat staging CT or PET-CT imaging could be performed once the patient's acute  issues have resolved. Emphysema. Aortic Atherosclerosis (ICD10-I70.0) and Emphysema (ICD10-J43.9). Electronically Signed   By: Fidela Salisbury MD   On: 05/06/2020 18:46   DG Chest Port 1 View  Result Date: 05/08/2020 CLINICAL DATA:  Status post thoracentesis EXAM: PORTABLE CHEST 1 VIEW COMPARISON:  May 06, 2020 chest radiograph and chest CT. FINDINGS: No appreciable pneumothorax. Left pleural effusion smaller after thoracentesis. There is there is again noted multifocal airspace opacity bilaterally, similar to 2 days prior. Heart is upper normal in size with pulmonary vascularity within normal limits. No adenopathy evident. Degenerative changes noted in the right shoulder with superior migration of the right humeral head. Note colonic interposition between the right hemidiaphragm and liver. IMPRESSION: No appreciable pneumothorax. Multifocal airspace opacity persists, likely due to multifocal pneumonia. Stable cardiac silhouette. Probable chronic rotator cuff tear on the right with superior migration of the right humeral head. Electronically Signed   By: Lowella Grip III M.D.   On: 05/08/2020 11:10   US THORACENTESIS ASP PLEURAL SPACE W/IMG GUIDE  Result Date: 05/08/2020 INDICATION: Patient with history of interstitial lung disease, CKD 3 - currently admitted multifocal pneumonia and large left pleural effusion on CT, request to IR for diagnostic and therapeutic thoracentesis. EXAM: ULTRASOUND GUIDED LEFT THORACENTESIS MEDICATIONS: 7 mL 1% lidocaine COMPLICATIONS: None immediate. PROCEDURE: An ultrasound guided thoracentesis was thoroughly discussed with the patient and questions answered. The benefits, risks, alternatives and complications were also discussed. The patient understands and wishes to proceed with the procedure. Written consent was obtained. Ultrasound was performed to localize and mark an adequate pocket of fluid in the left chest. The area was then prepped and draped in the normal  sterile fashion. 1% Lidocaine was used for local anesthesia. Under ultrasound guidance a 6 Fr Safe-T-Centesis catheter was introduced. Thoracentesis was performed. The catheter was removed and a dressing applied. FINDINGS: A total of approximately 1.4 L of hazy yellow fluid was removed. Samples were sent to the laboratory as requested by the clinical team. IMPRESSION: Successful ultrasound guided left thoracentesis yielding 1.4 L of pleural fluid. Read by Candiss Norse, PA-C Electronically Signed   By: Sandi Mariscal M.D.   On: 05/08/2020 10:52    Assessment & Plan    86 year old male with history of interstitial lung disease, dementia, Parkinson's disease, chronic kidney disease who was sent to the ER by his primary care physician after noting his pulse ox was 86% on room air.  Work-up showed A. fib with RVR with an elevated troponin in the 200s.  Patient is a difficult historian but did not complain of chest pain.    1.  Abnormal troponin-not related to acute coronary syndrome based on history, electrocardiogram and values.  Patient is not a candidate for heparin.  Not a candidate for invasive or noninvasive ischemic work-up given comorbidities.  Would continue with his  current medications including metoprolol tartrate 25 mg twice daily for A. fib rate control.  Will discontinue atorvastatin given age and comorbidities.  Elevated troponin likely demand.  2.  Atrial fibrillation-likely secondary to multiple lung issues including pneumonia and probable metastatic bronchogenic carcinoma.Called this am regarding heart rates in the 140-150 range. Pt is somewhat agitated . Heart rate has improved in the past with treatment of agitation. Rapid hr appears to be seconary to agitation this am. WIll ive 1 mg of haldol and follow heart rate .   3.  Respiratory failure-likely multifactorial to include bronchogenic carcinoma as well as airspace disease.  Does not appear to have significant pulmonary edema at  present.  We will continue to follow with empiric antibiotics.  4. CKD-renal funciton improved with creatinine of 1.6 down from 1.83. Pt is ++784 from admission.   5. Parkinsons-continue with sinemet.   6. Agitation-continue with haldol and ativan.   Signed, Javier Docker Shareena Nusz MD 05/09/2020, 7:34 AM  Pager: [phone]

## 2020-05-09 NOTE — Progress Notes (Signed)
[facility]  [phone] [phone]   Name: Adal Sereno Date: 05/09/2020 MRN: [id number]  DOB: [birth year]  Patient Care Team: Madelyn Brunner, MD as PCP - General (Internal Medicine)    REASON FOR CONSULTATION: Mike Smith is a 86 y.o. male with multiple medical problems including history of interstitial lung disease not on O2, Parkinson's disease, dementia, CKD 3, who was admitted to the hospital 05/07/2020 with hypoxic respiratory failure.  Patient was found to have multifocal pneumonia with an upper lobe lung mass, probable malignant pleural effusion, and hepatic metastasis on CT.  Patient was also found to have new onset A. fib with RVR.  He is not felt to likely be a candidate for aggressive cancer treatment.  Palliative care was consulted to help address goals..   CODE STATUS: DNR  PAST MEDICAL HISTORY: Past Medical History:  Diagnosis Date  . Atrial fibrillation ([place])   . Dementia ([place])     PAST SURGICAL HISTORY:  Past Surgical History:  Procedure Laterality Date  . SHOULDER SURGERY      HEMATOLOGY/ONCOLOGY HISTORY:  Oncology History   No history exists.    ALLERGIES:  has No Known Allergies.  MEDICATIONS:  Current Facility-Administered Medications  Medication Dose Route Frequency Provider Last Rate Last Admin  . carbidopa-levodopa (SINEMET IR) 25-100 MG per tablet immediate release 1 tablet  1 tablet Oral TID Athena Masse, MD   1 tablet at 05/09/20 1001  . cefTRIAXone (ROCEPHIN) 2 g in sodium chloride 0.9 % 100 mL IVPB  2 g Intravenous Q24H Athena Masse, MD   Stopped at 05/08/20 2151  . donepezil (ARICEPT) tablet 5 mg  5 mg Oral Daily Lu Duffel, RPH   5 mg at 05/09/20 1001  . DULoxetine (CYMBALTA) DR capsule 60 mg  60 mg Oral Daily Athena Masse, MD   60 mg at 05/09/20 1001  . enoxaparin (LOVENOX) injection 40 mg  40 mg Subcutaneous Q24H Sharen Hones,  MD   40 mg at 05/09/20 1147  . feeding supplement (ENSURE ENLIVE) (ENSURE ENLIVE) liquid 237 mL  237 mL Oral TID BM [place] Eric J, DO   237 mL at 05/09/20 1000  . haloperidol lactate (HALDOL) injection 2 mg  2 mg Intravenous Q3H PRN Sharen Hones, MD      . HYDROcodone-acetaminophen (NORCO/VICODIN) 5-325 MG per tablet 1 tablet  1 tablet Oral Q6H PRN Athena Masse, MD      . ipratropium-albuterol (DUONEB) 0.5-2.5 (3) MG/3ML nebulizer solution 3 mL  3 mL Nebulization Q6H PRN [place] Eric J, DO      . loratadine (CLARITIN) tablet 10 mg  10 mg Oral Daily Athena Masse, MD   10 mg at 05/09/20 1001  . metoprolol tartrate (LOPRESSOR) tablet 25 mg  25 mg Oral BID Athena Masse, MD   25 mg at 05/09/20 1001  . QUEtiapine (SEROQUEL) tablet 25 mg  25 mg Oral QHS Sharen Hones, MD        VITAL SIGNS: BP (!) 112/55 (BP Location: Left Arm)   Pulse (!) 148   Temp 97.7 F (36.5 C)   Resp 18   Ht '6\' 1"'  (1.854 m)   Wt 156 lb 8.4 oz (71 kg)   SpO2 (!) 89%   BMI 20.65 kg/m  Filed Weights   05/07/20 1530 05/08/20 0451 05/09/20 0623  Weight: 159 lb (72.1 kg) 156 lb 8 oz (71 kg) 156 lb 8.4  oz (71 kg)    Estimated body mass index is 20.65 kg/m as calculated from the following:   Height as of this encounter: '6\' 1"'  (1.854 m).   Weight as of this encounter: 156 lb 8.4 oz (71 kg).  LABS: CBC:    Component Value Date/Time   WBC 15.4 (H) 05/09/2020 0331   HGB 12.2 (L) 05/09/2020 0331   HGB 12.7 (L) 08/23/2014 2013   HCT 38.2 (L) 05/09/2020 0331   HCT 38.5 (L) 08/23/2014 2013   PLT 284 05/09/2020 0331   PLT 176 08/23/2014 2013   MCV 93.2 05/09/2020 0331   MCV 94 08/23/2014 2013   NEUTROABS 4.5 08/23/2014 2013   LYMPHSABS 1.2 08/23/2014 2013   MONOABS 0.8 08/23/2014 2013   EOSABS 0.3 08/23/2014 2013   BASOSABS 0.0 08/23/2014 2013   Comprehensive Metabolic Panel:    Component Value Date/Time   NA 141 05/09/2020 0331   NA 140 08/23/2014 2013   K 4.5 05/09/2020 0331   K 4.5 08/23/2014 2013     CL 105 05/09/2020 0331   CL 106 08/23/2014 2013   CO2 27 05/09/2020 0331   CO2 28 08/23/2014 2013   BUN 30 (H) 05/09/2020 0331   BUN 18 08/23/2014 2013   CREATININE 1.60 (H) 05/09/2020 0331   CREATININE 1.71 (H) 08/23/2014 2013   GLUCOSE 100 (H) 05/09/2020 0331   GLUCOSE 97 08/23/2014 2013   CALCIUM 8.6 (L) 05/09/2020 0331   CALCIUM 8.1 (L) 08/23/2014 2013   AST 25 05/09/2020 0331   AST 24 06/14/2013 1133   ALT 9 05/09/2020 0331   ALT 21 06/14/2013 1133   ALKPHOS 74 05/09/2020 0331   ALKPHOS 94 06/14/2013 1133   BILITOT 0.7 05/09/2020 0331   BILITOT 0.5 06/14/2013 1133   PROT 5.8 (L) 05/09/2020 0331   PROT 7.0 06/14/2013 1133   ALBUMIN 2.6 (L) 05/09/2020 0331   ALBUMIN 3.7 06/14/2013 1133    RADIOGRAPHIC STUDIES: DG Chest 1 View  Result Date: 05/06/2020 CLINICAL DATA:  86 year old male with history of shortness of breath and dyspnea. EXAM: CHEST  1 VIEW COMPARISON:  Chest x-ray 08/23/2014. FINDINGS: Multifocal airspace consolidation and interstitial prominence scattered asymmetrically throughout the lungs bilaterally, most confluent in the left upper lobe medially. Small left pleural effusion. No definite pneumothorax. Pulmonary vasculature is largely obscured. Heart size is normal. IMPRESSION: 1. Findings are concerning for severe multilobar bilateral pneumonia (left greater than right). The possibility of a centrally obstructing mass in the left upper lobe should be considered. Further evaluation with contrast enhanced chest CT could better evaluate these findings if clinically appropriate. Electronically Signed   By: Vinnie Langton M.D.   On: 05/06/2020 17:03   CT Chest W Contrast  Result Date: 05/06/2020 CLINICAL DATA:  Chest pain, dyspnea, episodic apnea EXAM: CT CHEST WITH CONTRAST TECHNIQUE: Multidetector CT imaging of the chest was performed during intravenous contrast administration. CONTRAST:  31m OMNIPAQUE IOHEXOL 300 MG/ML  SOLN COMPARISON:  None. FINDINGS:  Cardiovascular: Global cardiac size within normal limits. Moderate calcification of the aortic valve leaflets. Extensive coronary artery calcification largely within the right coronary artery. No pericardial effusions. Moderate atherosclerotic calcification within the thoracic aorta without evidence of aneurysm. Central pulmonary arteries are of normal caliber. Mediastinum/Nodes: There is extensive pathologic hilar and mediastinal adenopathy. Probable pathologic bilateral supraclavicular adenopathy. Index subcarinal lymph node measures 3.0 x 4.1 cm at axial image # 72/2 Lungs/Pleura: Moderate centrilobular emphysema. There is, superimposed, extensive multifocal airspace infiltrate within the left upper lobe, primarily,  but scattered within the right lower lobe and right middle lobe in keeping with changes of multifocal pneumonia. However, there is, superimposed, a suspected mass within the left upper lobe more peripherally, poorly visualized due to adjacent infiltrate and peripherally collapsed left upper lobe, but roughly measuring 3.6 x 3.7 x 5.1 cm on coronal image # 51/5 and sagittal image # 121/6. There is an associated large left pleural effusion and it is unclear whether this represents a parapneumonic effusion or malignant effusion. Small right pleural effusion. There are innumerable scattered pulmonary nodules predominantly within the lung bases which are indeterminate, but are suspicious for intrapulmonary metastases. Upper Abdomen: Multiple nodules are seen within the a drawing glands bilaterally which are not well characterized on this examination. There are multiple low-attenuation lesions seen within the liver suspicious for hepatic metastases given the additional findings. Musculoskeletal: Remote compression fracture of T11. No acute bone abnormality. No lytic or blastic bone lesion identified. IMPRESSION: Multifocal pulmonary infiltrates most in keeping with multifocal pneumonia in the acute setting.  Large left and small right pleural effusions are noted. Left pleural effusion is indeterminate and may represent either a parapneumonic or malignant effusion. Focal mass within the left upper lobe with obliteration of the a bronchi and postobstructive collapse of the more peripheral left upper lobe, suspicious for a primary bronchogenic neoplasm. Suspected pathologic bilateral supraclavicular, mediastinal, and bilateral hilar adenopathy. Suspected hepatic metastases. Indeterminate adrenal nodules bilaterally. Repeat staging CT or PET-CT imaging could be performed once the patient's acute issues have resolved. Emphysema. Aortic Atherosclerosis (ICD10-I70.0) and Emphysema (ICD10-J43.9). Electronically Signed   By: Fidela Salisbury MD   On: 05/06/2020 18:46   DG Chest Port 1 View  Result Date: 05/08/2020 CLINICAL DATA:  Status post thoracentesis EXAM: PORTABLE CHEST 1 VIEW COMPARISON:  May 06, 2020 chest radiograph and chest CT. FINDINGS: No appreciable pneumothorax. Left pleural effusion smaller after thoracentesis. There is there is again noted multifocal airspace opacity bilaterally, similar to 2 days prior. Heart is upper normal in size with pulmonary vascularity within normal limits. No adenopathy evident. Degenerative changes noted in the right shoulder with superior migration of the right humeral head. Note colonic interposition between the right hemidiaphragm and liver. IMPRESSION: No appreciable pneumothorax. Multifocal airspace opacity persists, likely due to multifocal pneumonia. Stable cardiac silhouette. Probable chronic rotator cuff tear on the right with superior migration of the right humeral head. Electronically Signed   By: Lowella Grip III M.D.   On: 05/08/2020 11:10   US THORACENTESIS ASP PLEURAL SPACE W/IMG GUIDE  Result Date: 05/08/2020 INDICATION: Patient with history of interstitial lung disease, CKD 3 - currently admitted multifocal pneumonia and large left pleural effusion on CT,  request to IR for diagnostic and therapeutic thoracentesis. EXAM: ULTRASOUND GUIDED LEFT THORACENTESIS MEDICATIONS: 7 mL 1% lidocaine COMPLICATIONS: None immediate. PROCEDURE: An ultrasound guided thoracentesis was thoroughly discussed with the patient and questions answered. The benefits, risks, alternatives and complications were also discussed. The patient understands and wishes to proceed with the procedure. Written consent was obtained. Ultrasound was performed to localize and mark an adequate pocket of fluid in the left chest. The area was then prepped and draped in the normal sterile fashion. 1% Lidocaine was used for local anesthesia. Under ultrasound guidance a 6 Fr Safe-T-Centesis catheter was introduced. Thoracentesis was performed. The catheter was removed and a dressing applied. FINDINGS: A total of approximately 1.4 L of hazy yellow fluid was removed. Samples were sent to the laboratory as requested by the clinical team.  IMPRESSION: Successful ultrasound guided left thoracentesis yielding 1.4 L of pleural fluid. Read by Candiss Norse, PA-C Electronically Signed   By: Sandi Mariscal M.D.   On: 05/08/2020 10:52    PERFORMANCE STATUS (ECOG) : 3 - Symptomatic, >50% confined to bed  Review of Systems Unable to provide  Physical Exam General: Frail appearing Pulmonary: Unlabored Extremities: no edema, no joint deformities Skin: no rashes Neurological: Weakness, some confusion but otherwise nonfocal  IMPRESSION: Patient agitated today is required frequent dosing of Haldol.  Agree with initiation of Seroquel at bedtime.  Oral intake has been minimal.  Cytology was positive for malignant cells.  I met with patient's daughter.  We discussed overall poor prognosis.  She is in agreement with not pursuing further work-up or treatment for the cancer.  Alternatively, we discussed the option of hospice either at home or in a residential hospice facility.  She is concerned that family would not want  to take care of patient at home even with hospice care.  She plans to speak with her brother tonight about the option of the Hospice Home.   PLAN: -Best supportive care -Recommend hospice involvement either at home or residential hospice facility.  Family are discussing this option -We will follow  Case and plan discussed with Dr. Rogue Bussing   Time Total: 30 minutes  Visit consisted of counseling and education dealing with the complex and emotionally intense issues of symptom management and palliative care in the setting of serious and potentially life-threatening illness.Greater than 50%  of this time was spent counseling and coordinating care related to the above assessment and plan.  Signed by: Altha Harm, PhD, NP-C

## 2020-05-09 NOTE — Assessment & Plan Note (Addendum)
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  #Acute hypoxic respiratory failure -~3lits  of oxygen-CT scan left upper lobe mass/moderate left pleural effusion; bilateral pneumonia-currently on IV antibiotics-mild improvement noted.  # CKD/A. fib with RVR-poorly controlled-defer to primary service.    #Mental status changes-metabolic encephalopathy/medication induced-slightly better; however still confused.  # Prognosis: DNR/DNI-unfortunately continues with poor. Continue IV antibiotics/medical management; when clinically stable discharge/hospice.  Unfortunately clinically would not expect significant improvement overall. Discussed with Dr. Roosevelt Locks.

## 2020-05-09 NOTE — Progress Notes (Signed)
PROGRESS NOTE    Mike Smith  XVQ:[id number] DOB: May 10, 1928 DOA: 05/06/2020 PCP: Madelyn Brunner, MD   Chief complaint.  Agitation.  Brief Narrative:  Mike Smith is a 86 y.o.malewith medical history significant forHTN, ILD not on oxygen, Parkinson's disease, dementia, CKD 3, who presents to the emergency room with a several day history of shortness of breath, seen by his PCP at [facility] clinic, where his O2 sat was 86% on room air. CT chest showed multifocal pneumonia, large left and small right pleural effusions, left upper lobe focal mass with postobstructive collapse, mediastinal and bilateral hilar adenopathy and suspected hepatic metastases.  Patient was diagnosed with multifocal pneumonia and placed on Rocephin and Zithromax.  He was also seen by oncology, no aggressive treatment is recommended.  He was also followed by cardiology for increased troponin and atrial fibrillation with RVR.  7/28.  1.4 L of fluids was removed yesterday by thoracentesis.  Initial work-up suggest malignant pleural effusion.  Completed 3 days of Zithromax.  Patient has profound agitation and tachycardia.  Receiving Haldol.  Also added 25 mg of Seroquel every evening for poor sleep at nighttime.   Assessment & Plan:   Principal Problem:   Acute respiratory failure with hypoxia (HCC) Active Problems:   Multifocal pneumonia   Mass of upper lobe of left lung   Elevated troponin   Benign essential hypertension   CRF (chronic renal failure), stage 3 (moderate)   ILD (interstitial lung disease) (HCC)   Parkinson disease (HCC)   Pleural effusion   Hepatic metastases on CT [facility])   New onset atrial fibrillation (HCC)   Palliative care encounter   Pressure injury of skin   Protein-calorie malnutrition, severe  1.  Acute hypoxemic respiratory failure. Multifactorial, secondary to pneumonia, bilateral pleural effusion, and a lung cancer. Continue to treat underlying  diseases.  2.  Multifocal pneumonia. Completed 3 days of Zithromax.  Continue Rocephin.  Blood culture so far is negative.  3.  Paroxysmal atrial fibrillation with rapid ventricular response. Appreciate cardiology consult.  Increase heart rate with thought to be mainly due to agitation.  I have increase Haldol to 1 mg every 3 hours as needed.  4.  Delirium with Parkinson dementia.  A component of acute metabolic encephalopathy. Patient appears to have some baseline dementia.  He does not sleep well at night, he was very agitated nighttime.  I will add Seroquel at night  in addition to as needed Haldol at the daytime.  Discontinue Zithromax for concern of prolonging QT interval.  5.  Left upper lobe lung mass with liver metastasis. Not a candidate for aggressive treatment.  Patient currently DNR, has been seen by palliative care.  6.  Malignant pleural effusion. Status post thoracentesis.  7.  Elevated troponin. Secondary to demand ischemia. Follow.  8.  Chronic kidney disease 3B. Continue to follow.      DVT prophylaxis: Lovenox Code Status: DNR Family Communication: Attempted to call patient's son cell and home numbers, did not go through. Disposition Plan:  . Patient came from:            . Anticipated d/c place: . Barriers to d/c OR conditions which need to be met to effect a safe d/c:   Consultants:   Cardiology  Procedures: Paracentesis Antimicrobials:  Rocephin  Subjective: Patient is very confused with some agitation.  Spoke with the nurse, patient did not sleep last night, he had a significant agitation.  Signal tachycardia due to agitation,  Does not seem to be shortness of breath. No nausea vomiting.  Objective: Vitals:   05/09/20 0610 05/09/20 0623 05/09/20 0721 05/09/20 1009  BP: 115/77 (!) 134/74 (!) 136/87 (!) 127/86  Pulse: 92 (!) 110 (!) 174 99  Resp:   17 16  Temp:   97.7 F (36.5 C) 97.7 F (36.5 C)  TempSrc:   Axillary Oral  SpO2:   98%  92%  Weight:  71 kg    Height:        Intake/Output Summary (Last 24 hours) at 05/09/2020 1058 Last data filed at 05/09/2020 0845 Gross per 24 hour  Intake 144 ml  Output 600 ml  Net -456 ml   Filed Weights   05/07/20 1530 05/08/20 0451 05/09/20 0623  Weight: 72.1 kg 71 kg 71 kg    Examination:  General exam: Alert but very confused with some agitation. Respiratory system: Decreased breathing sounds. Respiratory effort normal. Cardiovascular system: Irregular, tachycardic no JVD, murmurs, rubs, gallops or clicks. No pedal edema. Gastrointestinal system: Abdomen is nondistended, soft and nontender. No organomegaly or masses felt. Normal bowel sounds heard. Central nervous system: Confused. No focal neurological deficits. Extremities: Symmetric  Skin: No rashes, lesions or ulcers     Data Reviewed: I have personally reviewed following labs and imaging studies  CBC: Recent Labs  Lab 05/06/20 1638 05/08/20 0734 05/09/20 0331  WBC 13.5* 14.9* 15.4*  HGB 11.9* 12.5* 12.2*  HCT 34.6* 38.6* 38.2*  MCV 89.4 94.1 93.2  PLT 268 299 001   Basic Metabolic Panel: Recent Labs  Lab 05/06/20 1638 05/08/20 0734 05/09/20 0331  NA 138 140 141  K 4.5 4.5 4.5  CL 104 104 105  CO2 _0 GLUCOSE 102* 79 100*  BUN 31* 29* 30*  CREATININE 1.87* 1.83* 1.60*  CALCIUM 9.2 8.8* 8.6*  MG  --  1.9 2.2   GFR: Estimated Creatinine Clearance: 30.8 mL/min (A) (by C-G formula based on SCr of 1.6 mg/dL (H)). Liver Function Tests: Recent Labs  Lab 05/06/20 1638 05/08/20 0734 05/09/20 0331  AST _1 ALT _2 ALKPHOS 78 80 74  BILITOT 1.1 0.9 0.7  PROT 6.3* 6.3* 5.8*  ALBUMIN 2.9* 2.7* 2.6*   No results for input(s): LIPASE, AMYLASE in the last 168 hours. No results for input(s): AMMONIA in the last 168 hours. Coagulation Profile: No results for input(s): INR, PROTIME in the last 168 hours. Cardiac Enzymes: No results for input(s): CKTOTAL, CKMB, CKMBINDEX, TROPONINI  in the last 168 hours. BNP (last 3 results) No results for input(s): PROBNP in the last 8760 hours. HbA1C: No results for input(s): HGBA1C in the last 72 hours. CBG: No results for input(s): GLUCAP in the last 168 hours. Lipid Profile: No results for input(s): CHOL, HDL, LDLCALC, TRIG, CHOLHDL, LDLDIRECT in the last 72 hours. Thyroid Function Tests: No results for input(s): TSH, T4TOTAL, FREET4, T3FREE, THYROIDAB in the last 72 hours. Anemia Panel: No results for input(s): VITAMINB12, FOLATE, FERRITIN, TIBC, IRON, RETICCTPCT in the last 72 hours. Sepsis Labs: Recent Labs  Lab 05/06/20 1730  LATICACIDVEN 1.1    Recent Results (from the past 240 hour(s))  SARS Coronavirus 2 by RT PCR (hospital order, performed in [facility] hospital lab) Nasopharyngeal Nasopharyngeal Swab     Status: None   Collection Time: 05/06/20  4:38 PM   Specimen: Nasopharyngeal Swab  Result Value Ref Range Status   SARS Coronavirus 2 NEGATIVE NEGATIVE Final    Comment: (NOTE)  SARS-CoV-2 target nucleic acids are NOT DETECTED.  The SARS-CoV-2 RNA is generally detectable in upper and lower respiratory specimens during the acute phase of infection. The lowest concentration of SARS-CoV-2 viral copies this assay can detect is 250 copies / mL. A negative result does not preclude SARS-CoV-2 infection and should not be used as the sole basis for treatment or other patient management decisions.  A negative result may occur with improper specimen collection / handling, submission of specimen other than nasopharyngeal swab, presence of viral mutation(s) within the areas targeted by this assay, and inadequate number of viral copies (<250 copies / mL). A negative result must be combined with clinical observations, patient history, and epidemiological information.  Fact Sheet for Patients:   StrictlyIdeas.no  Fact Sheet for Healthcare  Providers: BankingDealers.co.za  This test is not yet approved or  cleared by the [place] FDA and has been authorized for detection and/or diagnosis of SARS-CoV-2 by FDA under an Emergency Use Authorization (EUA).  This EUA will remain in effect (meaning this test can be used) for the duration of the COVID-19 declaration under Section 564(b)(1) of the Act, 21 U.S.C. section 360bbb-3(b)(1), unless the authorization is terminated or revoked sooner.  Performed at [facility], [address] [place], [place] [address]   Blood culture (routine x 2)     Status: None (Preliminary result)   Collection Time: 05/06/20  5:29 PM   Specimen: BLOOD  Result Value Ref Range Status   Specimen Description BLOOD RIGHT HAND  Final   Special Requests   Final    BOTTLES DRAWN AEROBIC AND ANAEROBIC Blood Culture adequate volume   Culture   Final    NO GROWTH 3 DAYS Performed at [facility], [address], [place], [place] [address]    Report Status PENDING  Incomplete  Blood culture (routine x 2)     Status: None (Preliminary result)   Collection Time: 05/06/20  5:29 PM   Specimen: BLOOD  Result Value Ref Range Status   Specimen Description BLOOD RIGHT FOREARM  Final   Special Requests   Final    BOTTLES DRAWN AEROBIC AND ANAEROBIC Blood Culture results may not be optimal due to an excessive volume of blood received in culture bottles   Culture   Final    NO GROWTH 3 DAYS Performed at [facility], [address], [place], [place] [address]    Report Status PENDING  Incomplete  Body fluid culture     Status: None (Preliminary result)   Collection Time: 05/08/20 11:00 AM   Specimen: PATH Cytology Pleural fluid  Result Value Ref Range Status   Specimen Description   Final    PLEURAL Performed at [facility], [address], [place], [place] [address]    Special Requests   Final    NONE Performed at [facility],  [address], [place], [place] [address]    Gram Stain   Final    MODERATE WBC PRESENT, PREDOMINANTLY MONONUCLEAR NO ORGANISMS SEEN Performed at [facility], [place] [address] [place], [place] [address]    Culture PENDING  Incomplete   Report Status PENDING  Incomplete         Radiology Studies: DG Chest Port 1 View  Result Date: 05/08/2020 CLINICAL DATA:  Status post thoracentesis EXAM: PORTABLE CHEST 1 VIEW COMPARISON:  May 06, 2020 chest radiograph and chest CT. FINDINGS: No appreciable pneumothorax. Left pleural effusion smaller after thoracentesis. There is there  is again noted multifocal airspace opacity bilaterally, similar to 2 days prior. Heart is upper normal in size with pulmonary vascularity within normal limits. No adenopathy evident. Degenerative changes noted in the right shoulder with superior migration of the right humeral head. Note colonic interposition between the right hemidiaphragm and liver. IMPRESSION: No appreciable pneumothorax. Multifocal airspace opacity persists, likely due to multifocal pneumonia. Stable cardiac silhouette. Probable chronic rotator cuff tear on the right with superior migration of the right humeral head. Electronically Signed   By: Lowella Grip III M.D.   On: 05/08/2020 11:10   US THORACENTESIS ASP PLEURAL SPACE W/IMG GUIDE  Result Date: 05/08/2020 INDICATION: Patient with history of interstitial lung disease, CKD 3 - currently admitted multifocal pneumonia and large left pleural effusion on CT, request to IR for diagnostic and therapeutic thoracentesis. EXAM: ULTRASOUND GUIDED LEFT THORACENTESIS MEDICATIONS: 7 mL 1% lidocaine COMPLICATIONS: None immediate. PROCEDURE: An ultrasound guided thoracentesis was thoroughly discussed with the patient and questions answered. The benefits, risks, alternatives and complications were also discussed. The patient understands and wishes to proceed with the procedure. Written consent was obtained.  Ultrasound was performed to localize and mark an adequate pocket of fluid in the left chest. The area was then prepped and draped in the normal sterile fashion. 1% Lidocaine was used for local anesthesia. Under ultrasound guidance a 6 Fr Safe-T-Centesis catheter was introduced. Thoracentesis was performed. The catheter was removed and a dressing applied. FINDINGS: A total of approximately 1.4 L of hazy yellow fluid was removed. Samples were sent to the laboratory as requested by the clinical team. IMPRESSION: Successful ultrasound guided left thoracentesis yielding 1.4 L of pleural fluid. Read by Candiss Norse, PA-C Electronically Signed   By: Sandi Mariscal M.D.   On: 05/08/2020 10:52        Scheduled Meds: . carbidopa-levodopa  1 tablet Oral TID  . donepezil  5 mg Oral Daily  . DULoxetine  60 mg Oral Daily  . feeding supplement (ENSURE ENLIVE)  237 mL Oral TID BM  . loratadine  10 mg Oral Daily  . metoprolol tartrate  25 mg Oral BID  . QUEtiapine  25 mg Oral QHS   Continuous Infusions: . cefTRIAXone (ROCEPHIN)  IV Stopped (05/08/20 2151)     LOS: 3 days    Time spent: 35 minutes    Sharen Hones, MD [facility]   To contact the attending provider between 7A-7P or the covering provider during after hours 7P-7A, please log into the web site [email or web address] and access using universal [place] password for that web site. If you do not have the password, please call the hospital operator.  05/09/2020, 10:58 AM

## 2020-05-09 NOTE — Progress Notes (Signed)
Naythen Heikkila   DOB:11-Sep-1928   JY#:[id number]    Subjective: No family by the bedside.  As per the nursing staff patient had a "rough night".  Agitated needing Haldol/Ativan.  Patient is heart rate 140s to 170s.  Currently resting for sedation.  Objective:  Vitals:   05/09/20 1413 05/09/20 1637  BP:  (!) 113/86  Pulse:  (!) 161  Resp:  17  Temp:  98 F (36.7 C)  SpO2: (!) 89%      Intake/Output Summary (Last 24 hours) at 05/09/2020 2040 Last data filed at 05/09/2020 1858 Gross per 24 hour  Intake --  Output 700 ml  Net -700 ml    Physical Exam Constitutional:      Comments: Cachectic appearing Caucasian male patient.  On 6 L of oxygen.  Alone.  HENT:     Head: Normocephalic and atraumatic.     Mouth/Throat:     Pharynx: No oropharyngeal exudate.  Eyes:     Pupils: Pupils are equal, round, and reactive to light.  Cardiovascular:     Comments: Tachycardic irregularly rhythm. Pulmonary:     Effort: No respiratory distress.     Breath sounds: No wheezing.     Comments: Course breath sounds; decreased breath sounds left compared to right. Abdominal:     General: Bowel sounds are normal. There is no distension.     Palpations: Abdomen is soft. There is no mass.     Tenderness: There is no abdominal tenderness. There is no guarding or rebound.  Musculoskeletal:        General: No tenderness. Normal range of motion.     Cervical back: Normal range of motion and neck supple.  Skin:    General: Skin is warm.  Neurological:     Comments: Drowsy/not arousable.  Psychiatric:        Mood and Affect: Affect normal.      Labs:  Lab Results  Component Value Date   WBC 15.4 (H) 05/09/2020   HGB 12.2 (L) 05/09/2020   HCT 38.2 (L) 05/09/2020   MCV 93.2 05/09/2020   PLT 284 05/09/2020   NEUTROABS 4.5 08/23/2014    Lab Results  Component Value Date   NA 141 05/09/2020   K 4.5 05/09/2020   CL 105 05/09/2020   CO2 27 05/09/2020    Studies:  DG Chest Port 1  View  Result Date: 05/08/2020 CLINICAL DATA:  Status post thoracentesis EXAM: PORTABLE CHEST 1 VIEW COMPARISON:  May 06, 2020 chest radiograph and chest CT. FINDINGS: No appreciable pneumothorax. Left pleural effusion smaller after thoracentesis. There is there is again noted multifocal airspace opacity bilaterally, similar to 2 days prior. Heart is upper normal in size with pulmonary vascularity within normal limits. No adenopathy evident. Degenerative changes noted in the right shoulder with superior migration of the right humeral head. Note colonic interposition between the right hemidiaphragm and liver. IMPRESSION: No appreciable pneumothorax. Multifocal airspace opacity persists, likely due to multifocal pneumonia. Stable cardiac silhouette. Probable chronic rotator cuff tear on the right with superior migration of the right humeral head. Electronically Signed   By: Lowella Grip III M.D.   On: 05/08/2020 11:10   US THORACENTESIS ASP PLEURAL SPACE W/IMG GUIDE  Result Date: 05/08/2020 INDICATION: Patient with history of interstitial lung disease, CKD 3 - currently admitted multifocal pneumonia and large left pleural effusion on CT, request to IR for diagnostic and therapeutic thoracentesis. EXAM: ULTRASOUND GUIDED LEFT THORACENTESIS MEDICATIONS: 7 mL 1% lidocaine COMPLICATIONS: None  immediate. PROCEDURE: An ultrasound guided thoracentesis was thoroughly discussed with the patient and questions answered. The benefits, risks, alternatives and complications were also discussed. The patient understands and wishes to proceed with the procedure. Written consent was obtained. Ultrasound was performed to localize and mark an adequate pocket of fluid in the left chest. The area was then prepped and draped in the normal sterile fashion. 1% Lidocaine was used for local anesthesia. Under ultrasound guidance a 6 Fr Safe-T-Centesis catheter was introduced. Thoracentesis was performed. The catheter was removed and a  dressing applied. FINDINGS: A total of approximately 1.4 L of hazy yellow fluid was removed. Samples were sent to the laboratory as requested by the clinical team. IMPRESSION: Successful ultrasound guided left thoracentesis yielding 1.4 L of pleural fluid. Read by Candiss Norse, PA-C Electronically Signed   By: Sandi Mariscal M.D.   On: 05/08/2020 10:52    Mass of upper lobe of left lung     Cancer of upper lobe of left lung [facility]) # 86 year old male patient with multiple medical problems including Parkinson's disease dementia; chronic kidney disease; COPD/long-term history of smoking-currently admitted to hospital for worsening shortness of breath cough  #Acute hypoxic respiratory failure -6 L of oxygen-CT scan left upper lobe mass/moderate left pleural effusion; bilateral pneumonia-currently on IV antibiotics-no significant improvement noted  #Left upper lobe mass /pleural effusion -stage IV lung cancer -cytology positive for malignancy -IHC stains pending. Discussed with Dr. Reuel Derby.  #Newly diagnosed A. fib with RVR-rate not well controlled; appreciate cardiology evaluation.  #Mental status changes-metabolic encephalopathy [Ativan/underlying pneumonia] vs-question malignancy of the brain-no improvement  Plan: Patient continues to have tenuous cardiorespiratory status-in context of his newly diagnosed lung cancer portends unfortunately very poor prognosis.  Patient is not a candidate for any systemic therapy for his lung cancer.  Hospice is recommended.  Josh Borders discussed with the patient's daughter.  I tried to reach the patient's son Elta Guadeloupe, unfortunately mailbox full-cannot leave a message.  Again will reach out tomorrow.    Cammie Sickle, MD 05/09/2020  8:40 PM

## 2020-05-09 NOTE — Care Management Important Message (Signed)
Important Message  Patient Details  Name: Mike Smith MRN: [id number] Date of Birth: 25-Apr-1928   Medicare Important Message Given:  Yes     Dannette Barbara 05/09/2020, 10:53 AM

## 2020-05-09 NOTE — Progress Notes (Signed)
Patient with HR-161s-170s and oxygenation of 80s. Patient is mouth breathing so fase shield provided with good results. Patient taking off face shield so nasal cannula placed with 90-92 at 4L of humidified air. Will continue to monitor. Central telemetry confirms increased HR. Patient restless and confused. Medication ordered x2 with minimal effectiveness. See new orders. Dr. Ubaldo Glassing at bedside to monitor with orders given and implemented. Dr. Roosevelt Locks also at bedside to monitor and awaiting any additional orders. BP stable. RN will continue to monitor.

## 2020-05-09 NOTE — Progress Notes (Signed)
Throughout the night patient was increasingly agitated. When BP was stable 2 mg PRN Haldol administered. During push patient became catatonic so only 1 mg was administered. The patient later became more agitated and a one time 0.25 mg Ativan was given. The patient's HR is elevated by his agitation ranging from 120s-170s. MD made aware. See new orders.

## 2020-05-09 NOTE — Progress Notes (Signed)
Received report for Nurse.   Patient HR jumped up into 170s again with ambulation (to [facility]).   Gave ativan for agitation - per report- possible cause of increase HR.     Paged MD about heart rate and to let him know family at bedside   MD ordered oral Cardizem

## 2020-05-10 DIAGNOSIS — C3412 Malignant neoplasm of upper lobe, left bronchus or lung: Principal | ICD-10-CM

## 2020-05-10 DIAGNOSIS — E43 Unspecified severe protein-calorie malnutrition: Secondary | ICD-10-CM

## 2020-05-10 DIAGNOSIS — J90 Pleural effusion, not elsewhere classified: Secondary | ICD-10-CM

## 2020-05-10 DIAGNOSIS — J9601 Acute respiratory failure with hypoxia: Secondary | ICD-10-CM | POA: Diagnosis not present

## 2020-05-10 LAB — CBC
HCT: 40.1 % (ref 39.0–52.0)
Hemoglobin: 12.7 g/dL — ABNORMAL LOW (ref 13.0–17.0)
MCH: 29.8 pg (ref 26.0–34.0)
MCHC: 31.7 g/dL (ref 30.0–36.0)
MCV: 94.1 fL (ref 80.0–100.0)
Platelets: 246 10*3/uL (ref 150–400)
RBC: 4.26 MIL/uL (ref 4.22–5.81)
RDW: 15.7 % — ABNORMAL HIGH (ref 11.5–15.5)
WBC: 14.9 10*3/uL — ABNORMAL HIGH (ref 4.0–10.5)
nRBC: 0 % (ref 0.0–0.2)

## 2020-05-10 LAB — MAGNESIUM: Magnesium: 2.2 mg/dL (ref 1.7–2.4)

## 2020-05-10 LAB — COMPREHENSIVE METABOLIC PANEL
ALT: <5 U/L (ref 0–44)
AST: 31 U/L (ref 15–41)
Albumin: 2.4 g/dL — ABNORMAL LOW (ref 3.5–5.0)
Alkaline Phosphatase: 68 U/L (ref 38–126)
Anion gap: 15 (ref 5–15)
BUN: 27 mg/dL — ABNORMAL HIGH (ref 8–23)
CO2: 22 mmol/L (ref 22–32)
Calcium: 8.2 mg/dL — ABNORMAL LOW (ref 8.9–10.3)
Chloride: 105 mmol/L (ref 98–111)
Creatinine, Ser: 1.44 mg/dL — ABNORMAL HIGH (ref 0.61–1.24)
GFR calc Af Amer: 49 mL/min — ABNORMAL LOW (ref >60)
GFR calc non Af Amer: 42 mL/min — ABNORMAL LOW (ref >60)
Glucose, Bld: 98 mg/dL (ref 70–99)
Potassium: 4.4 mmol/L (ref 3.5–5.1)
Sodium: 142 mmol/L (ref 135–145)
Total Bilirubin: 1 mg/dL (ref 0.3–1.2)
Total Protein: 5.5 g/dL — ABNORMAL LOW (ref 6.5–8.1)

## 2020-05-10 LAB — PROCALCITONIN: Procalcitonin: 30.18 ng/mL

## 2020-05-10 MED ORDER — ACETAMINOPHEN 500 MG PO TABS
1000.0000 mg | ORAL_TABLET | Freq: Once | ORAL | Status: AC
  Administered 2020-05-10: 1000 mg via ORAL
  Filled 2020-05-10: qty 2

## 2020-05-10 MED ORDER — DILTIAZEM HCL 30 MG PO TABS
60.0000 mg | ORAL_TABLET | Freq: Three times a day (TID) | ORAL | Status: DC
  Filled 2020-05-10: qty 2

## 2020-05-10 MED ORDER — PIPERACILLIN-TAZOBACTAM 3.375 G IVPB
3.3750 g | Freq: Three times a day (TID) | INTRAVENOUS | Status: DC
  Administered 2020-05-10 – 2020-05-12 (×7): 3.375 g via INTRAVENOUS
  Filled 2020-05-10 (×7): qty 50

## 2020-05-10 NOTE — Progress Notes (Signed)
Patient difficult to arouse at this time. Unable to give oral medications. Notated on the [facility]. Will monitor closely.

## 2020-05-10 NOTE — Progress Notes (Signed)
PROGRESS NOTE    Mike Smith  HFW:[id number] DOB: Sep 04, 1928 DOA: 05/06/2020 PCP: Madelyn Brunner, MD   Chief complaint.  Shortness of breath.  Brief Narrative:  Mike Smith a 86 y.o.malewith medical history significant forHTN, ILD not on oxygen, Parkinson's disease, dementia, CKD 3, who presents to the emergency room with a several day history of shortness of breath, seen by his PCP at [facility] clinic, where his O2 sat was 86% on room air. CT chest showed multifocal pneumonia, large left and small right pleural effusions, left upper lobe focal mass with postobstructive collapse, mediastinal and bilateral hilar adenopathy and suspected hepatic metastases.  Patient was diagnosed with multifocal pneumonia and placed on Rocephin and Zithromax.  He was also seen by oncology, no aggressive treatment is recommended.  He was also followed by cardiology for increased troponin and atrial fibrillation with RVR.  7/28.  1.4 L of fluids was removed yesterday by thoracentesis.  Initial work-up suggest malignant pleural effusion.  Completed 3 days of Zithromax.  Patient has profound agitation and tachycardia.  Receiving Haldol.  Also added 25 mg of Seroquel every evening for poor sleep at nighttime.  7/29.  Patient is less agitated today.  Heart rate is slightly better.  Diltiazem was added yesterday which reduce the heart rate to 60s, change dose to 60 mg 3 times a day.  Profound elevation of procalcitonin level, antibiotic changed to Zosyn for aspiration pneumonia.     Assessment & Plan:   Principal Problem:   Acute respiratory failure with hypoxia (HCC) Active Problems:   Multifocal pneumonia   Mass of upper lobe of left lung   Elevated troponin   Benign essential hypertension   CRF (chronic renal failure), stage 3 (moderate)   ILD (interstitial lung disease) (HCC)   Parkinson disease (HCC)   Pleural effusion   Hepatic metastases on CT [facility])   New onset  atrial fibrillation (HCC)   Palliative care encounter   Pressure injury of skin   Protein-calorie malnutrition, severe   Cancer of upper lobe of left lung ([place])  #1.  Acute hypoxemic respiratory failure. Secondary to pneumonia and bilateral pleural effusion and lung cancer. Condition stable.  Continue treat underlying disease and oxygen treatment.  2.  Multifocal pneumonia. Most likely aspiration pneumonia, patient seems not able to clear her upper airway.  He still has profound elevation of procalcitonin level, change antibiotics to Zosyn.  3.  Paroxysmal atrial fibrillation with rapid ventricle response. Agitation is better, heart rate still fast, will add back diltiazem 3 times a day at a lower dose and 60 mg 3 times a day.  4.  Delirium with Parkinson dementia.  Also has acute metabolic encephalopathy. Patient slept better last night with the Seroquel.  Continue.  I have discontinued Haldol to avoid additional QT interval prolongation.  5.  Left upper lobe lung mass with liver metastasis. Not a candidate for any treatment.  6.  Malignant pleural effusion. Follow.  7.  Elevated troponin. Secondary to demand ischemia.  8.  Chronic kidney disease stage IIIb. Renal function still stable.  DVT prophylaxis: Lovenox Code Status: DNR Family Communication: Attempted to call patient's son cell and home numbers, did not go through. Disposition Plan:   Patient came from:  Anticipated d/c place:  Barriers to d/c OR conditions which need to be met to effect a safe d/c:   Consultants:   Cardiology  Procedures: Paracentesis Antimicrobials:  Zosyn  Subjective: Spoke with the nurse, patient slept at least 4 hours overnight.  Less agitated today. Has significant tachycardia on telemetry. Patient does not seem to have any short of breath.  No cough.   However, he does not appear to be able to clear his upper airway.  Objective: Vitals:   05/09/20 1413 05/09/20 1637 05/09/20 2055 05/10/20 0628  BP:  (!) 113/86 112/73 122/66  Pulse:  (!) 161 73 63  Resp:  17  18  Temp:  98 F (36.7 C) 98 F (36.7 C) 98 F (36.7 C)  TempSrc:  Oral Axillary   SpO2: (!) 89%  98% 90%  Weight:      Height:        Intake/Output Summary (Last 24 hours) at 05/10/2020 1410 Last data filed at 05/10/2020 0514 Gross per 24 hour  Intake 204.79 ml  Output 500 ml  Net -295.21 ml   Filed Weights   05/07/20 1530 05/08/20 0451 05/09/20 0623  Weight: 72.1 kg 71 kg 71 kg    Examination:  General exam: Appears calm and comfortable  Respiratory system: Rhonchi in the base. Respiratory effort normal. Cardiovascular system: Irregular, tachycardic no JVD, murmurs, rubs, gallops or clicks. No pedal edema. Gastrointestinal system: Abdomen is nondistended, soft and nontender. No organomegaly or masses felt. Normal bowel sounds heard. Central nervous system: Confused. No focal neurological deficits. Extremities: Symmetric  Skin: No rashes, lesions or ulcers      Data Reviewed: I have personally reviewed following labs and imaging studies  CBC: Recent Labs  Lab 05/06/20 1638 05/08/20 0734 05/09/20 0331 05/10/20 0424  WBC 13.5* 14.9* 15.4* 14.9*  HGB 11.9* 12.5* 12.2* 12.7*  HCT 34.6* 38.6* 38.2* 40.1  MCV 89.4 94.1 93.2 94.1  PLT 268 299 284 858   Basic Metabolic Panel: Recent Labs  Lab 05/06/20 1638 05/08/20 0734 05/09/20 0331 05/10/20 0424  NA 138 140 141 142  K 4.5 4.5 4.5 4.4  CL 104 104 105 105  CO2 _0 GLUCOSE 102* 79 100* 98  BUN 31* 29* 30* 27*  CREATININE 1.87* 1.83* 1.60* 1.44*  CALCIUM 9.2 8.8* 8.6* 8.2*  MG  --  1.9 2.2 2.2   GFR: Estimated Creatinine Clearance: 34.2 mL/min (A) (by C-G formula based on SCr of 1.44 mg/dL (H)). Liver Function Tests: Recent Labs  Lab 05/06/20 1638 05/08/20 0734 05/09/20 0331  05/10/20 0424  AST _1 ALT _2 <5  ALKPHOS 78 80 74 68  BILITOT 1.1 0.9 0.7 1.0  PROT 6.3* 6.3* 5.8* 5.5*  ALBUMIN 2.9* 2.7* 2.6* 2.4*   No results for input(s): LIPASE, AMYLASE in the last 168 hours. No results for input(s): AMMONIA in the last 168 hours. Coagulation Profile: No results for input(s): INR, PROTIME in the last 168 hours. Cardiac Enzymes: No results for input(s): CKTOTAL, CKMB, CKMBINDEX, TROPONINI in the last 168 hours. BNP (last 3 results) No results for input(s): PROBNP in the last 8760 hours. HbA1C: No results for input(s): HGBA1C in the last 72 hours. CBG: No results for input(s): GLUCAP in the last 168 hours. Lipid Profile: No results for input(s): CHOL, HDL, LDLCALC, TRIG, CHOLHDL, LDLDIRECT in the last 72 hours. Thyroid Function Tests: No results for input(s): TSH, T4TOTAL, FREET4, T3FREE, THYROIDAB in the last 72  hours. Anemia Panel: No results for input(s): VITAMINB12, FOLATE, FERRITIN, TIBC, IRON, RETICCTPCT in the last 72 hours. Sepsis Labs: Recent Labs  Lab 05/06/20 1730 05/10/20 0424  PROCALCITON  --  30.18  LATICACIDVEN 1.1  --     Recent Results (from the past 240 hour(s))  SARS Coronavirus 2 by RT PCR (hospital order, performed in [facility] hospital lab) Nasopharyngeal Nasopharyngeal Swab     Status: None   Collection Time: 05/06/20  4:38 PM   Specimen: Nasopharyngeal Swab  Result Value Ref Range Status   SARS Coronavirus 2 NEGATIVE NEGATIVE Final    Comment: (NOTE) SARS-CoV-2 target nucleic acids are NOT DETECTED.  The SARS-CoV-2 RNA is generally detectable in upper and lower respiratory specimens during the acute phase of infection. The lowest concentration of SARS-CoV-2 viral copies this assay can detect is 250 copies / mL. A negative result does not preclude SARS-CoV-2 infection and should not be used as the sole basis for treatment or other patient management decisions.  A negative result may occur with improper  specimen collection / handling, submission of specimen other than nasopharyngeal swab, presence of viral mutation(s) within the areas targeted by this assay, and inadequate number of viral copies (<250 copies / mL). A negative result must be combined with clinical observations, patient history, and epidemiological information.  Fact Sheet for Patients:   StrictlyIdeas.no  Fact Sheet for Healthcare Providers: BankingDealers.co.za  This test is not yet approved or  cleared by the [place] FDA and has been authorized for detection and/or diagnosis of SARS-CoV-2 by FDA under an Emergency Use Authorization (EUA).  This EUA will remain in effect (meaning this test can be used) for the duration of the COVID-19 declaration under Section 564(b)(1) of the Act, 21 U.S.C. section 360bbb-3(b)(1), unless the authorization is terminated or revoked sooner.  Performed at [facility], [address] [place], [place] [address]   Blood culture (routine x 2)     Status: None (Preliminary result)   Collection Time: 05/06/20  5:29 PM   Specimen: BLOOD  Result Value Ref Range Status   Specimen Description BLOOD RIGHT HAND  Final   Special Requests   Final    BOTTLES DRAWN AEROBIC AND ANAEROBIC Blood Culture adequate volume   Culture   Final    NO GROWTH 4 DAYS Performed at [facility], [address] [place], [place] [address]    Report Status PENDING  Incomplete  Blood culture (routine x 2)     Status: None (Preliminary result)   Collection Time: 05/06/20  5:29 PM   Specimen: BLOOD  Result Value Ref Range Status   Specimen Description BLOOD RIGHT FOREARM  Final   Special Requests   Final    BOTTLES DRAWN AEROBIC AND ANAEROBIC Blood Culture results may not be optimal due to an excessive volume of blood received in culture bottles   Culture   Final    NO GROWTH 4 DAYS Performed at [facility], [address], [place], [place] [address]    Report Status PENDING  Incomplete  Body fluid culture     Status: None (Preliminary result)   Collection Time: 05/08/20 11:00 AM   Specimen: PATH Cytology Pleural fluid  Result Value Ref Range Status   Specimen Description   Final    PLEURAL Performed at [facility], [address] [place],  [address]    Special Requests   Final    NONE Performed at [facility],  [place], [place] [address]    Gram Stain   Final    MODERATE WBC PRESENT, PREDOMINANTLY MONONUCLEAR NO ORGANISMS SEEN    Culture   Final    NO GROWTH 2 DAYS Performed at [facility] [address], [place],  [address]    Report Status PENDING  Incomplete         Radiology Studies: No results found.      Scheduled Meds: . carbidopa-levodopa  1 tablet Oral TID  . donepezil  5 mg Oral Daily  . DULoxetine  60 mg Oral Daily  . enoxaparin (LOVENOX) injection  40 mg Subcutaneous Q24H  . feeding supplement (ENSURE ENLIVE)  237 mL Oral TID BM  . loratadine  10 mg Oral Daily  . metoprolol tartrate  25 mg Oral BID  . QUEtiapine  25 mg Oral QHS  . sodium chloride flush  10 mL Intravenous Q12H   Continuous Infusions: . sodium chloride Stopped (05/09/20 2234)  . piperacillin-tazobactam 3.375 g (05/10/20 1036)     LOS: 4 days    Time spent: 28 minutes    Sharen Hones, MD [facility]   To contact the attending provider between 7A-7P or the covering provider during after hours 7P-7A, please log into the web site [email or web address] and access using universal [place] password for that web site. If you do not have the password, please call the hospital operator.  05/10/2020, 2:10 PM

## 2020-05-10 NOTE — Progress Notes (Signed)
Aslan Himes   DOB:29-Jan-1928   WU#:[id number]    Subjective: Patient continues to be obtunded; daughter and son by the bedside.  Patient continues to be in A. fib with RVR-heart rate better controlled.  Objective:  Vitals:   05/10/20 1445 05/10/20 1600  BP: 92/71 93/70  Pulse: 80 76  Resp: 13   Temp:  98.1 F (36.7 C)  SpO2:  100%     Intake/Output Summary (Last 24 hours) at 05/10/2020 1852 Last data filed at 05/10/2020 7253 Gross per 24 hour  Intake 204.79 ml  Output 500 ml  Net -295.21 ml    Physical Exam Constitutional:      Comments: Cachectic appearing Caucasian male patient.  On 6 L of oxygen.  Alone.  HENT:     Head: Normocephalic and atraumatic.     Mouth/Throat:     Pharynx: No oropharyngeal exudate.  Eyes:     Pupils: Pupils are equal, round, and reactive to light.  Cardiovascular:     Comments: Tachycardic irregularly rhythm. Pulmonary:     Effort: No respiratory distress.     Breath sounds: No wheezing.     Comments: Course breath sounds; decreased breath sounds left compared to right. Abdominal:     General: Bowel sounds are normal. There is no distension.     Palpations: Abdomen is soft. There is no mass.     Tenderness: There is no abdominal tenderness. There is no guarding or rebound.  Musculoskeletal:        General: No tenderness. Normal range of motion.     Cervical back: Normal range of motion and neck supple.  Skin:    General: Skin is warm.  Neurological:     Comments: Drowsy/not arousable.  Psychiatric:        Mood and Affect: Affect normal.     Labs:  Lab Results  Component Value Date   WBC 14.9 (H) 05/10/2020   HGB 12.7 (L) 05/10/2020   HCT 40.1 05/10/2020   MCV 94.1 05/10/2020   PLT 246 05/10/2020   NEUTROABS 4.5 08/23/2014    Lab Results  Component Value Date   NA 142 05/10/2020   K 4.4 05/10/2020   CL 105 05/10/2020   CO2 22 05/10/2020    Studies:  No results found.  Mass of upper lobe of left  lung     Cancer of upper lobe of left lung [facility]) # 86 year old male patient with multiple medical problems including Parkinson's disease dementia; chronic kidney disease; COPD/long-term history of smoking-currently admitted to hospital for worsening shortness of breath cough  #Left upper lobe lung cancer/pleural effusion stage IV-IHC stains pending.  #Acute hypoxic respiratory failure -4.5 L of oxygen-CT scan left upper lobe mass/moderate left pleural effusion; bilateral pneumonia-currently on IV antibiotics-no significant improvement noted  #CKD/A. fib with RVR-rate controlled  #Mental status changes-metabolic encephalopathy/medication induced  Plan of care:   I had a long discussion the patient's daughter and son regarding the extremely poor prognosis given his age/comorbidities-new diagnosis of stage IV lung cancer; in the context of pneumonia.  Patient is not a candidate for any systemic therapy for his lung cancer.  Family in agreement.  Family however wants to give patient a chance with antibiotics to treat the pneumonia-understanding that it is very unlikely patient will recover/improve from his illness.  If patient condition does not improve-they are in agreement for hospice.  Discussed with Dr. Roosevelt Locks.  Discussed with [employer].   # 40 minutes face-to-face with the patient's  daughter and son discussing the above plan of care; more than 50% of time spent on prognosis/ natural history; counseling and coordination.   Cammie Sickle, MD 05/10/2020  6:52 PM

## 2020-05-11 DIAGNOSIS — G20 Parkinson's disease: Secondary | ICD-10-CM

## 2020-05-11 DIAGNOSIS — J189 Pneumonia, unspecified organism: Secondary | ICD-10-CM

## 2020-05-11 DIAGNOSIS — Z515 Encounter for palliative care: Secondary | ICD-10-CM | POA: Diagnosis not present

## 2020-05-11 DIAGNOSIS — J9601 Acute respiratory failure with hypoxia: Secondary | ICD-10-CM | POA: Diagnosis not present

## 2020-05-11 LAB — CULTURE, BLOOD (ROUTINE X 2)
Culture: NO GROWTH
Culture: NO GROWTH
Special Requests: ADEQUATE

## 2020-05-11 LAB — CYTOLOGY - NON PAP

## 2020-05-11 LAB — BODY FLUID CULTURE: Culture: NO GROWTH

## 2020-05-11 MED ORDER — ACETAMINOPHEN 500 MG PO TABS: 1000.0000 mg | ORAL_TABLET | Freq: Four times a day (QID) | ORAL | Status: DC | PRN

## 2020-05-11 MED ORDER — HALOPERIDOL LACTATE 5 MG/ML IJ SOLN
2.0000 mg | Freq: Once | INTRAMUSCULAR | Status: AC
  Administered 2020-05-11: 2 mg via INTRAVENOUS
  Filled 2020-05-11: qty 1

## 2020-05-11 NOTE — Progress Notes (Signed)
[place]  [phone] [phone]   Name: Mike Smith Date: 05/11/2020 MRN: [id number]  DOB: 1928/03/20  Patient Care Team: Madelyn Brunner, MD as PCP - General (Internal Medicine)    REASON FOR CONSULTATION: Mike Smith is a 86 y.o. male with multiple medical problems including history of interstitial lung disease not on O2, Parkinson's disease, dementia, CKD 3, who was admitted to the hospital 05/07/2020 with hypoxic respiratory failure.  Patient was found to have multifocal pneumonia with an upper lobe lung mass, probable malignant pleural effusion, and hepatic metastasis on CT.  Patient was also found to have new onset A. fib with RVR.  He is not felt to likely be a candidate for aggressive cancer treatment.  Palliative care was consulted to help address goals..   CODE STATUS: DNR  PAST MEDICAL HISTORY: Past Medical History:  Diagnosis Date  . Atrial fibrillation ([place])   . Dementia ([place])     PAST SURGICAL HISTORY:  Past Surgical History:  Procedure Laterality Date  . SHOULDER SURGERY      HEMATOLOGY/ONCOLOGY HISTORY:  Oncology History   No history exists.    ALLERGIES:  has No Known Allergies.  MEDICATIONS:  Current Facility-Administered Medications  Medication Dose Route Frequency Provider Last Rate Last Admin  . 0.9 %  sodium chloride infusion   Intravenous PRN Sharen Hones, MD   Stopped at 05/09/20 2234  . acetaminophen (TYLENOL) tablet 1,000 mg  1,000 mg Oral Q6H PRN Sharion Settler, NP      . carbidopa-levodopa (SINEMET IR) 25-100 MG per tablet immediate release 1 tablet  1 tablet Oral TID Athena Masse, MD   1 tablet at 05/11/20 [phone]  . donepezil (ARICEPT) tablet 5 mg  5 mg Oral Daily Lu Duffel, RPH   5 mg at 05/11/20 6720  . DULoxetine (CYMBALTA) DR capsule 60 mg  60 mg Oral Daily Athena Masse, MD   60 mg at 05/11/20 9470  . enoxaparin (LOVENOX)  injection 40 mg  40 mg Subcutaneous Q24H Sharen Hones, MD   40 mg at 05/11/20 1205  . feeding supplement (ENSURE ENLIVE) (ENSURE ENLIVE) liquid 237 mL  237 mL Oral TID BM [place] Eric J, DO   237 mL at 05/11/20 1000  . ipratropium-albuterol (DUONEB) 0.5-2.5 (3) MG/3ML nebulizer solution 3 mL  3 mL Nebulization Q6H PRN [place] Eric J, DO      . loratadine (CLARITIN) tablet 10 mg  10 mg Oral Daily Athena Masse, MD   10 mg at 05/11/20 9628  . metoprolol tartrate (LOPRESSOR) tablet 25 mg  25 mg Oral BID Athena Masse, MD   25 mg at 05/11/20 3662  . piperacillin-tazobactam (ZOSYN) IVPB 3.375 g  3.375 g Intravenous Redmond Pulling, MD 12.5 mL/hr at 05/11/20 0510 3.375 g at 05/11/20 0510  . QUEtiapine (SEROQUEL) tablet 25 mg  25 mg Oral QHS Sharen Hones, MD   25 mg at 05/10/20 2000  . sodium chloride flush (NS) 0.9 % injection 10 mL  10 mL Intravenous Q12H Sharen Hones, MD   10 mL at 05/09/20 2136    VITAL SIGNS: BP 117/67 (BP Location: Left Arm)   Pulse 81   Temp 97.8 F (36.6 C) (Oral)   Resp 17   Ht 6\' 1"  (1.854 m)   Wt 154 lb 5.2 oz (70 kg)   SpO2 100%   BMI 20.36 kg/m  Filed Weights   05/08/20 0451  05/09/20 0623 05/11/20 0500  Weight: 156 lb 8 oz (71 kg) 156 lb 8.4 oz (71 kg) 154 lb 5.2 oz (70 kg)    Estimated body mass index is 20.36 kg/m as calculated from the following:   Height as of this encounter: 6\' 1"  (1.854 m).   Weight as of this encounter: 154 lb 5.2 oz (70 kg).  LABS: CBC:    Component Value Date/Time   WBC 14.9 (H) 05/10/2020 0424   HGB 12.7 (L) 05/10/2020 0424   HGB 12.7 (L) 08/23/2014 2013   HCT 40.1 05/10/2020 0424   HCT 38.5 (L) 08/23/2014 2013   PLT 246 05/10/2020 0424   PLT 176 08/23/2014 2013   MCV 94.1 05/10/2020 0424   MCV 94 08/23/2014 2013   NEUTROABS 4.5 08/23/2014 2013   LYMPHSABS 1.2 08/23/2014 2013   MONOABS 0.8 08/23/2014 2013   EOSABS 0.3 08/23/2014 2013   BASOSABS 0.0 08/23/2014 2013   Comprehensive Metabolic Panel:    Component  Value Date/Time   NA 142 05/10/2020 0424   NA 140 08/23/2014 2013   K 4.4 05/10/2020 0424   K 4.5 08/23/2014 2013   CL 105 05/10/2020 0424   CL 106 08/23/2014 2013   CO2 22 05/10/2020 0424   CO2 28 08/23/2014 2013   BUN 27 (H) 05/10/2020 0424   BUN 18 08/23/2014 2013   CREATININE 1.44 (H) 05/10/2020 0424   CREATININE 1.71 (H) 08/23/2014 2013   GLUCOSE 98 05/10/2020 0424   GLUCOSE 97 08/23/2014 2013   CALCIUM 8.2 (L) 05/10/2020 0424   CALCIUM 8.1 (L) 08/23/2014 2013   AST 31 05/10/2020 0424   AST 24 06/14/2013 1133   ALT <5 05/10/2020 0424   ALT 21 06/14/2013 1133   ALKPHOS 68 05/10/2020 0424   ALKPHOS 94 06/14/2013 1133   BILITOT 1.0 05/10/2020 0424   BILITOT 0.5 06/14/2013 1133   PROT 5.5 (L) 05/10/2020 0424   PROT 7.0 06/14/2013 1133   ALBUMIN 2.4 (L) 05/10/2020 0424   ALBUMIN 3.7 06/14/2013 1133    RADIOGRAPHIC STUDIES: DG Chest 1 View  Result Date: 05/06/2020 CLINICAL DATA:  86 year old male with history of shortness of breath and dyspnea. EXAM: CHEST  1 VIEW COMPARISON:  Chest x-ray 08/23/2014. FINDINGS: Multifocal airspace consolidation and interstitial prominence scattered asymmetrically throughout the lungs bilaterally, most confluent in the left upper lobe medially. Small left pleural effusion. No definite pneumothorax. Pulmonary vasculature is largely obscured. Heart size is normal. IMPRESSION: 1. Findings are concerning for severe multilobar bilateral pneumonia (left greater than right). The possibility of a centrally obstructing mass in the left upper lobe should be considered. Further evaluation with contrast enhanced chest CT could better evaluate these findings if clinically appropriate. Electronically Signed   By: Vinnie Langton M.D.   On: 05/06/2020 17:03   CT Chest W Contrast  Result Date: 05/06/2020 CLINICAL DATA:  Chest pain, dyspnea, episodic apnea EXAM: CT CHEST WITH CONTRAST TECHNIQUE: Multidetector CT imaging of the chest was performed during  intravenous contrast administration. CONTRAST:  8mL OMNIPAQUE IOHEXOL 300 MG/ML  SOLN COMPARISON:  None. FINDINGS: Cardiovascular: Global cardiac size within normal limits. Moderate calcification of the aortic valve leaflets. Extensive coronary artery calcification largely within the right coronary artery. No pericardial effusions. Moderate atherosclerotic calcification within the thoracic aorta without evidence of aneurysm. Central pulmonary arteries are of normal caliber. Mediastinum/Nodes: There is extensive pathologic hilar and mediastinal adenopathy. Probable pathologic bilateral supraclavicular adenopathy. Index subcarinal lymph node measures 3.0 x 4.1 cm at axial image #  72/2 Lungs/Pleura: Moderate centrilobular emphysema. There is, superimposed, extensive multifocal airspace infiltrate within the left upper lobe, primarily, but scattered within the right lower lobe and right middle lobe in keeping with changes of multifocal pneumonia. However, there is, superimposed, a suspected mass within the left upper lobe more peripherally, poorly visualized due to adjacent infiltrate and peripherally collapsed left upper lobe, but roughly measuring 3.6 x 3.7 x 5.1 cm on coronal image # 51/5 and sagittal image # 121/6. There is an associated large left pleural effusion and it is unclear whether this represents a parapneumonic effusion or malignant effusion. Small right pleural effusion. There are innumerable scattered pulmonary nodules predominantly within the lung bases which are indeterminate, but are suspicious for intrapulmonary metastases. Upper Abdomen: Multiple nodules are seen within the a drawing glands bilaterally which are not well characterized on this examination. There are multiple low-attenuation lesions seen within the liver suspicious for hepatic metastases given the additional findings. Musculoskeletal: Remote compression fracture of T11. No acute bone abnormality. No lytic or blastic bone lesion  identified. IMPRESSION: Multifocal pulmonary infiltrates most in keeping with multifocal pneumonia in the acute setting. Large left and small right pleural effusions are noted. Left pleural effusion is indeterminate and may represent either a parapneumonic or malignant effusion. Focal mass within the left upper lobe with obliteration of the a bronchi and postobstructive collapse of the more peripheral left upper lobe, suspicious for a primary bronchogenic neoplasm. Suspected pathologic bilateral supraclavicular, mediastinal, and bilateral hilar adenopathy. Suspected hepatic metastases. Indeterminate adrenal nodules bilaterally. Repeat staging CT or PET-CT imaging could be performed once the patient's acute issues have resolved. Emphysema. Aortic Atherosclerosis (ICD10-I70.0) and Emphysema (ICD10-J43.9). Electronically Signed   By: Fidela Salisbury MD   On: 05/06/2020 18:46   DG Chest Port 1 View  Result Date: 05/08/2020 CLINICAL DATA:  Status post thoracentesis EXAM: PORTABLE CHEST 1 VIEW COMPARISON:  May 06, 2020 chest radiograph and chest CT. FINDINGS: No appreciable pneumothorax. Left pleural effusion smaller after thoracentesis. There is there is again noted multifocal airspace opacity bilaterally, similar to 2 days prior. Heart is upper normal in size with pulmonary vascularity within normal limits. No adenopathy evident. Degenerative changes noted in the right shoulder with superior migration of the right humeral head. Note colonic interposition between the right hemidiaphragm and liver. IMPRESSION: No appreciable pneumothorax. Multifocal airspace opacity persists, likely due to multifocal pneumonia. Stable cardiac silhouette. Probable chronic rotator cuff tear on the right with superior migration of the right humeral head. Electronically Signed   By: Lowella Grip III M.D.   On: 05/08/2020 11:10   US THORACENTESIS ASP PLEURAL SPACE W/IMG GUIDE  Result Date: 05/08/2020 INDICATION: Patient with  history of interstitial lung disease, CKD 3 - currently admitted multifocal pneumonia and large left pleural effusion on CT, request to IR for diagnostic and therapeutic thoracentesis. EXAM: ULTRASOUND GUIDED LEFT THORACENTESIS MEDICATIONS: 7 mL 1% lidocaine COMPLICATIONS: None immediate. PROCEDURE: An ultrasound guided thoracentesis was thoroughly discussed with the patient and questions answered. The benefits, risks, alternatives and complications were also discussed. The patient understands and wishes to proceed with the procedure. Written consent was obtained. Ultrasound was performed to localize and mark an adequate pocket of fluid in the left chest. The area was then prepped and draped in the normal sterile fashion. 1% Lidocaine was used for local anesthesia. Under ultrasound guidance a 6 Fr Safe-T-Centesis catheter was introduced. Thoracentesis was performed. The catheter was removed and a dressing applied. FINDINGS: A total of approximately 1.4 L  of hazy yellow fluid was removed. Samples were sent to the laboratory as requested by the clinical team. IMPRESSION: Successful ultrasound guided left thoracentesis yielding 1.4 L of pleural fluid. Read by Candiss Norse, PA-C Electronically Signed   By: Sandi Mariscal M.D.   On: 05/08/2020 10:52    PERFORMANCE STATUS (ECOG) : 3 - Symptomatic, >50% confined to bed  Review of Systems Unable to provide  Physical Exam General: Frail appearing Pulmonary: Unlabored Extremities: no edema, no joint deformities Skin: no rashes Neurological: Weakness, some confusion but otherwise nonfocal  IMPRESSION: Patient is less agitated but remains confused. Poor oral intake. Overall, frail appearing.   I have discussed hospice (either home or residential hospice facility) with family but they were undecided.  No family currently present.  I tried calling patient's son but was unable to reach him and unable to leave voicemail.  I was also unable to reach patient's  daughter.  PLAN: -Best supportive care -Recommend hospice involvement either at home or residential hospice facility.  Family are discussing this option -We will follow   Time Total: 15 minutes  Visit consisted of counseling and education dealing with the complex and emotionally intense issues of symptom management and palliative care in the setting of serious and potentially life-threatening illness.Greater than 50%  of this time was spent counseling and coordinating care related to the above assessment and plan.  Signed by: Altha Harm, PhD, NP-C

## 2020-05-11 NOTE — Plan of Care (Signed)

## 2020-05-11 NOTE — Progress Notes (Signed)
°   05/11/20 0600  Assess: MEWS Score  ECG Heart Rate (!) 115  Resp 20  Assess: MEWS Score  MEWS Temp 0  MEWS Systolic 0  MEWS Pulse 2  MEWS RR 0  MEWS LOC 0  MEWS Score 2  MEWS Score Color Yellow  Assess: if the MEWS score is Yellow or Red  Were vital signs taken at a resting state? No  Focused Assessment No change from prior assessment  Early Detection of Sepsis Score *See Row Information* Low  MEWS guidelines implemented *See Row Information* No, vital signs rechecked

## 2020-05-11 NOTE — Plan of Care (Signed)
  Problem: Safety: Goal: Ability to remain free from injury will improve Outcome: Progressing   Problem: Respiratory: Goal: Ability to maintain adequate ventilation will improve Outcome: Progressing

## 2020-05-11 NOTE — Progress Notes (Signed)
   05/11/20 0300  Assess: MEWS Score  ECG Heart Rate (!) 115  Resp (!) 25  Assess: MEWS Score  MEWS Temp 0  MEWS Systolic 0  MEWS Pulse 2  MEWS RR 1  MEWS LOC 0  MEWS Score 3  MEWS Score Color Yellow  Assess: if the MEWS score is Yellow or Red  Were vital signs taken at a resting state? No (pt agitated; will recheck HR/RR)  Early Detection of Sepsis Score *See Row Information* Low  MEWS guidelines implemented *See Row Information* No, vital signs rechecked

## 2020-05-11 NOTE — Progress Notes (Signed)
Patient Name: Mike Smith Date of Encounter: 05/11/2020  Hospital Problem List     Principal Problem:   Acute respiratory failure with hypoxia [facility]) Active Problems:   Multifocal pneumonia   Mass of upper lobe of left lung   Elevated troponin   Benign essential hypertension   CRF (chronic renal failure), stage 3 (moderate)   ILD (interstitial lung disease) (HCC)   Parkinson disease (HCC)   Pleural effusion   Hepatic metastases on CT ([place])   New onset atrial fibrillation ([place])   Palliative care encounter   Pressure injury of skin   Protein-calorie malnutrition, severe   Cancer of upper lobe of left lung ([place])    Patient Profile     86 y.o.malewith history ofParkinson's disease, dementia, interstitial lung disease not treated with oxygen, hypertension, chronic kidney disease grade 3 who was sent to the ER by his primary care physician after being noted to have oxygen saturations of 86 on room air. Patient not able to give history. He apparently had had decreased appetite and weakness. No chest pain. However troponins were drawn and noted to be 204 and 222. BNP was 232. His baseline creatinine is 1.7 and his creatinine which was 1.87 in the emergency room. Chest CT showed multifocal pneumonia and large left and small right pleural effusions. He had a left upper lobe focal mass with postobstructive collapsealong with mediastinal and bilateral hilar adenopathy. Patient was started on empiric antibiotics. Consultation requested for abnormal troponin. EKG revealed atrial fibrillation with no ischemia  Subjective     Inpatient Medications    . carbidopa-levodopa  1 tablet Oral TID  . donepezil  5 mg Oral Daily  . DULoxetine  60 mg Oral Daily  . enoxaparin (LOVENOX) injection  40 mg Subcutaneous Q24H  . feeding supplement (ENSURE ENLIVE)  237 mL Oral TID BM  . loratadine  10 mg Oral Daily  . metoprolol tartrate  25 mg Oral BID  . QUEtiapine  25 mg Oral  QHS  . sodium chloride flush  10 mL Intravenous Q12H    Vital Signs    Vitals:   05/11/20 0512 05/11/20 0600 05/11/20 0618 05/11/20 0726  BP:    (!) 125/108  Pulse:    100  Resp: 14 20 17 17   Temp:    97.7 F (36.5 C)  TempSrc:    Oral  SpO2:    (!) 89%  Weight:      Height:        Intake/Output Summary (Last 24 hours) at 05/11/2020 0833 Last data filed at 05/11/2020 0600 Gross per 24 hour  Intake 582.91 ml  Output 1150 ml  Net -567.09 ml   Filed Weights   05/08/20 0451 05/09/20 0623 05/11/20 0500  Weight: 71 kg 71 kg 70 kg    Physical Exam      Labs    CBC Recent Labs    05/09/20 0331 05/10/20 0424  WBC 15.4* 14.9*  HGB 12.2* 12.7*  HCT 38.2* 40.1  MCV 93.2 94.1  PLT 284 417   Basic Metabolic Panel Recent Labs    05/09/20 0331 05/10/20 0424  NA 141 142  K 4.5 4.4  CL 105 105  CO2 27 22  GLUCOSE 100* 98  BUN 30* 27*  CREATININE 1.60* 1.44*  CALCIUM 8.6* 8.2*  MG 2.2 2.2   Liver Function Tests Recent Labs    05/09/20 0331 05/10/20 0424  AST 25 31  ALT 9 <5  ALKPHOS 74 68  BILITOT 0.7 1.0  PROT 5.8* 5.5*  ALBUMIN 2.6* 2.4*   No results for input(s): LIPASE, AMYLASE in the last 72 hours. Cardiac Enzymes No results for input(s): CKTOTAL, CKMB, CKMBINDEX, TROPONINI in the last 72 hours. BNP No results for input(s): BNP in the last 72 hours. D-Dimer No results for input(s): DDIMER in the last 72 hours. Hemoglobin A1C No results for input(s): HGBA1C in the last 72 hours. Fasting Lipid Panel No results for input(s): CHOL, HDL, LDLCALC, TRIG, CHOLHDL, LDLDIRECT in the last 72 hours. Thyroid Function Tests No results for input(s): TSH, T4TOTAL, T3FREE, THYROIDAB in the last 72 hours.  Invalid input(s): FREET3  Telemetry    afib with variable vr with occasional rvr.  ECG    afib  Radiology    DG Chest 1 View  Result Date: 05/06/2020 CLINICAL DATA:  86 year old male with history of shortness of breath and dyspnea. EXAM: CHEST  1  VIEW COMPARISON:  Chest x-ray 08/23/2014. FINDINGS: Multifocal airspace consolidation and interstitial prominence scattered asymmetrically throughout the lungs bilaterally, most confluent in the left upper lobe medially. Small left pleural effusion. No definite pneumothorax. Pulmonary vasculature is largely obscured. Heart size is normal. IMPRESSION: 1. Findings are concerning for severe multilobar bilateral pneumonia (left greater than right). The possibility of a centrally obstructing mass in the left upper lobe should be considered. Further evaluation with contrast enhanced chest CT could better evaluate these findings if clinically appropriate. Electronically Signed   By: Vinnie Langton M.D.   On: 05/06/2020 17:03   CT Chest W Contrast  Result Date: 05/06/2020 CLINICAL DATA:  Chest pain, dyspnea, episodic apnea EXAM: CT CHEST WITH CONTRAST TECHNIQUE: Multidetector CT imaging of the chest was performed during intravenous contrast administration. CONTRAST:  16mL OMNIPAQUE IOHEXOL 300 MG/ML  SOLN COMPARISON:  None. FINDINGS: Cardiovascular: Global cardiac size within normal limits. Moderate calcification of the aortic valve leaflets. Extensive coronary artery calcification largely within the right coronary artery. No pericardial effusions. Moderate atherosclerotic calcification within the thoracic aorta without evidence of aneurysm. Central pulmonary arteries are of normal caliber. Mediastinum/Nodes: There is extensive pathologic hilar and mediastinal adenopathy. Probable pathologic bilateral supraclavicular adenopathy. Index subcarinal lymph node measures 3.0 x 4.1 cm at axial image # 72/2 Lungs/Pleura: Moderate centrilobular emphysema. There is, superimposed, extensive multifocal airspace infiltrate within the left upper lobe, primarily, but scattered within the right lower lobe and right middle lobe in keeping with changes of multifocal pneumonia. However, there is, superimposed, a suspected mass within  the left upper lobe more peripherally, poorly visualized due to adjacent infiltrate and peripherally collapsed left upper lobe, but roughly measuring 3.6 x 3.7 x 5.1 cm on coronal image # 51/5 and sagittal image # 121/6. There is an associated large left pleural effusion and it is unclear whether this represents a parapneumonic effusion or malignant effusion. Small right pleural effusion. There are innumerable scattered pulmonary nodules predominantly within the lung bases which are indeterminate, but are suspicious for intrapulmonary metastases. Upper Abdomen: Multiple nodules are seen within the a drawing glands bilaterally which are not well characterized on this examination. There are multiple low-attenuation lesions seen within the liver suspicious for hepatic metastases given the additional findings. Musculoskeletal: Remote compression fracture of T11. No acute bone abnormality. No lytic or blastic bone lesion identified. IMPRESSION: Multifocal pulmonary infiltrates most in keeping with multifocal pneumonia in the acute setting. Large left and small right pleural effusions are noted. Left pleural effusion is indeterminate and may represent either a parapneumonic or malignant effusion.  Focal mass within the left upper lobe with obliteration of the a bronchi and postobstructive collapse of the more peripheral left upper lobe, suspicious for a primary bronchogenic neoplasm. Suspected pathologic bilateral supraclavicular, mediastinal, and bilateral hilar adenopathy. Suspected hepatic metastases. Indeterminate adrenal nodules bilaterally. Repeat staging CT or PET-CT imaging could be performed once the patient's acute issues have resolved. Emphysema. Aortic Atherosclerosis (ICD10-I70.0) and Emphysema (ICD10-J43.9). Electronically Signed   By: Fidela Salisbury MD   On: 05/06/2020 18:46   DG Chest Port 1 View  Result Date: 05/08/2020 CLINICAL DATA:  Status post thoracentesis EXAM: PORTABLE CHEST 1 VIEW COMPARISON:   May 06, 2020 chest radiograph and chest CT. FINDINGS: No appreciable pneumothorax. Left pleural effusion smaller after thoracentesis. There is there is again noted multifocal airspace opacity bilaterally, similar to 2 days prior. Heart is upper normal in size with pulmonary vascularity within normal limits. No adenopathy evident. Degenerative changes noted in the right shoulder with superior migration of the right humeral head. Note colonic interposition between the right hemidiaphragm and liver. IMPRESSION: No appreciable pneumothorax. Multifocal airspace opacity persists, likely due to multifocal pneumonia. Stable cardiac silhouette. Probable chronic rotator cuff tear on the right with superior migration of the right humeral head. Electronically Signed   By: Lowella Grip III M.D.   On: 05/08/2020 11:10   US THORACENTESIS ASP PLEURAL SPACE W/IMG GUIDE  Result Date: 05/08/2020 INDICATION: Patient with history of interstitial lung disease, CKD 3 - currently admitted multifocal pneumonia and large left pleural effusion on CT, request to IR for diagnostic and therapeutic thoracentesis. EXAM: ULTRASOUND GUIDED LEFT THORACENTESIS MEDICATIONS: 7 mL 1% lidocaine COMPLICATIONS: None immediate. PROCEDURE: An ultrasound guided thoracentesis was thoroughly discussed with the patient and questions answered. The benefits, risks, alternatives and complications were also discussed. The patient understands and wishes to proceed with the procedure. Written consent was obtained. Ultrasound was performed to localize and mark an adequate pocket of fluid in the left chest. The area was then prepped and draped in the normal sterile fashion. 1% Lidocaine was used for local anesthesia. Under ultrasound guidance a 6 Fr Safe-T-Centesis catheter was introduced. Thoracentesis was performed. The catheter was removed and a dressing applied. FINDINGS: A total of approximately 1.4 L of hazy yellow fluid was removed. Samples were sent to  the laboratory as requested by the clinical team. IMPRESSION: Successful ultrasound guided left thoracentesis yielding 1.4 L of pleural fluid. Read by Candiss Norse, PA-C Electronically Signed   By: Sandi Mariscal M.D.   On: 05/08/2020 10:52    Assessment & Plan    86 year old male with history of interstitial lung disease, dementia, Parkinson's disease, chronic kidney disease who was sent to the ER by his primary care physician after noting his pulse ox was 86% on room air. Work-up showed A. fib with RVR with an elevated troponin in the 200s. Patient is a difficult historian but did not complain of chest pain.   1. Abnormal troponin-not related to acute coronary syndrome based on history, electrocardiogram and values. Patient is not a candidate for heparin. Not a candidate for invasive or noninvasive ischemic work-up given comorbidities. Would continue with his current medications including metoprolol tartrate 25 mg twice daily for A. fib rate control. Will discontinue atorvastatin given age and comorbidities. Elevated troponin likely demand.  2. Atrial fibrillation-likely secondary to multiple lung issues including pneumonia and probable metastatic bronchogenic carcinoma. Rapid hr appears to be due to agitation as he responds to haldol. Will continue with metoprolol  3. Respiratory  failure-likely multifactorial to include bronchogenic carcinoma as well as airspace disease. Does not appear to have significant pulmonary edema at present. We will continue to follow with empiric antibiotics.  4. CKD-renal funciton improved with creatinine of  1.44 which is improved .   5. Parkinsons-continue with sinemet.   6. Agitation-continue with haldol and ativan.   Dr. Nehemiah Massed will be following our  patients this weekend.   Signed, Javier Docker Andraya Frigon MD 05/11/2020, 8:33 AM  Pager: [phone]

## 2020-05-11 NOTE — Care Management Important Message (Signed)
Important Message  Patient Details  Name: Mike Smith MRN: [id number] Date of Birth: 18-Sep-1928   Medicare Important Message Given:  Yes     Dannette Barbara 05/11/2020, 11:49 AM

## 2020-05-11 NOTE — Progress Notes (Addendum)
PROGRESS NOTE    Mike Smith  XBJ:[id number] DOB: Dec 01, 1927 DOA: 05/06/2020 PCP: Madelyn Brunner, MD   Chief complaint.  Shortness of breath. Brief Narrative: Mike Boardley Franklinis a 86 y.o.malewith medical history significant forHTN, ILD not on oxygen, Parkinson's disease, dementia, CKD 3, who presents to the emergency room with a several day history of shortness of breath, seen by his PCP at [facility] clinic, where his O2 sat was 86% on room air. CT chest showed multifocal pneumonia, large left and small right pleural effusions, left upper lobe focal mass with postobstructive collapse, mediastinal and bilateral hilar adenopathy and suspected hepatic metastases. Patient was diagnosed with multifocal pneumonia and placed on Rocephin and Zithromax. He was also seen by oncology, no aggressive treatment is recommended. He was also followed by cardiology for increased troponin and atrial fibrillation with RVR.  7/28.1.4 L of fluids was removed yesterday by thoracentesis. Initial work-up suggest malignant pleural effusion. Completed 3 days of Zithromax.Patient has profound agitation and tachycardia. Receiving Haldol. Also added 25 mg of Seroquel every evening for poor sleep at nighttime.  7/29.  Patient is less agitated today.  Heart rate is slightly better.  Diltiazem was added yesterday which reduce the heart rate to 60s, change dose to 60 mg 3 times a day.  Profound elevation of procalcitonin level, antibiotic changed to Zosyn for aspiration pneumonia.  7/30.  Patient seemed to be doing better.  Not able to give a diltiazem as patient heart rate was better and the blood pressure was soft.  Continue Zosyn for another day.  Patient has a poor p.o. intake.  Assessment & Plan:   Principal Problem:   Acute respiratory failure with hypoxia (HCC) Active Problems:   Multifocal pneumonia   Mass of upper lobe of left lung   Elevated troponin   Benign essential  hypertension   CRF (chronic renal failure), stage 3 (moderate)   ILD (interstitial lung disease) (HCC)   Parkinson disease (HCC)   Pleural effusion   Hepatic metastases on CT [facility])   New onset atrial fibrillation (HCC)   Palliative care encounter   Pressure injury of skin   Protein-calorie malnutrition, severe   Cancer of upper lobe of left lung ([place])  #1.  Acute hypoxemic restaurant failure secondary to aspiration pneumonia as well as malignant pleural effusion. New oxygen treatment.  2.  Multifocal pneumonia secondary to aspiration pneumonia. Condition improving after antibiotic was switched to Zosyn.  Recheck a procalcitonin level tomorrow, may consider change to oral antibiotics.  However, patient condition is terminal, aspiration unlikely to improve.  He will have recurrent aspiration.  At this point, family still wants to treat and improve his pneumonia.  But this could improve very difficult as patient has recurrent aspiration.  3.  Paroxysmal atrial fibrillation with rapid ventricular response. Patient fast heart rate seem to correlate with agitation.  Seroquel was added to improve sleep and agitation.  4.  Delirium with Parkinson dementia. Continue Seroquel at nighttime.  5.  Left upper lobe lung mass with liver metastasis and malignant pleural effusion. Follow.  Not a candidate for any cancer treatment.  6.  Elevated troponin secondary to demand ischemia  7.  Chronic kidney disease stage IIIb.  8. Pressure ulcer POA.  Pressure Injury 05/07/20 Ischial tuberosity Right Stage 2 -  Partial thickness loss of dermis presenting as a shallow open injury with a red, pink wound bed without slough. (Active)  05/07/20 1530  Location: Ischial tuberosity  Location Orientation: Right  Staging: Stage 2 -  Partial thickness loss of dermis presenting as a shallow open injury with a red, pink wound bed without slough.  Wound Description (Comments):   Present on Admission: Yes       Pressure Injury 05/07/20 Buttocks Left Stage 2 -  Partial thickness loss of dermis presenting as a shallow open injury with a red, pink wound bed without slough. (Active)  05/07/20 1530  Location: Buttocks  Location Orientation: Left  Staging: Stage 2 -  Partial thickness loss of dermis presenting as a shallow open injury with a red, pink wound bed without slough.  Wound Description (Comments):   Present on Admission: Yes       Patient condition is terminal, family still wish to treat his medical condition, but at the same time, want to transfer him to inpatient hospice in a stable condition.  Currently there is no bed in inpatient hospice facility, will continue treating him in the hospital for now.  DVT prophylaxis:Lovenox Code Status:DNR Family Communication: None Disposition Plan:  Patient came from:  Anticipated d/c place:  Barriers to d/c OR conditions which need to be met to effect a safe d/c:   Consultants:  Cardiology  Procedures: Paracentesis Antimicrobials: Zosyn    Subjective: Patient is less agitated today.  Does not seem to have any shortness of breath. Very confused.  Objective: Vitals:   05/11/20 0512 05/11/20 0600 05/11/20 0618 05/11/20 0726  BP:    (!) 125/108  Pulse:    100  Resp: '14 20 17 17  ' Temp:    97.7 F (36.5 C)  TempSrc:    Oral  SpO2:    (!) 89%  Weight:      Height:        Intake/Output Summary (Last 24 hours) at 05/11/2020 1045 Last data filed at 05/11/2020 0600 Gross per 24 hour  Intake 582.91 ml  Output 1150 ml  Net -567.09 ml   Filed Weights   05/08/20 0451 05/09/20 0623 05/11/20 0500  Weight: 71 kg 71 kg 70 kg    Examination:  General exam: Appear chronically ill.  No respite distress. Respiratory system: Decreased breathing sounds. Respiratory effort normal. Cardiovascular system:  Irregularly irregular, rate under control today. No JVD, murmurs, Gastrointestinal system: Abdomen is nondistended, soft and nontender. No organomegaly or masses felt. Normal bowel sounds heard. Central nervous system: Confused, no agitation.  No focal neurological deficits. Extremities: Symmetric  Skin: No rashes, lesions or ulcers     Data Reviewed: I have personally reviewed following labs and imaging studies  CBC: Recent Labs  Lab 05/06/20 1638 05/08/20 0734 05/09/20 0331 05/10/20 0424  WBC 13.5* 14.9* 15.4* 14.9*  HGB 11.9* 12.5* 12.2* 12.7*  HCT 34.6* 38.6* 38.2* 40.1  MCV 89.4 94.1 93.2 94.1  PLT 268 299 284 119   Basic Metabolic Panel: Recent Labs  Lab 05/06/20 1638 05/08/20 0734 05/09/20 0331 05/10/20 0424  NA 138 140 141 142  K 4.5 4.5 4.5 4.4  CL 104 104 105 105  CO2 '22 25 27 22  ' GLUCOSE 102* 79 100* 98  BUN 31* 29* 30* 27*  CREATININE 1.87* 1.83* 1.60* 1.44*  CALCIUM 9.2 8.8* 8.6* 8.2*  MG  --  1.9 2.2 2.2   GFR: Estimated Creatinine Clearance: 33.8 mL/min (A) (by C-G formula based on SCr of 1.44 mg/dL (H)). Liver Function Tests: Recent Labs  Lab 05/06/20 1638 05/08/20 0734 05/09/20 0331 05/10/20 0424  AST '31 28 25 31  ' ALT '6 21 9 ' <5  ALKPHOS 78 80 74 68  BILITOT 1.1 0.9 0.7 1.0  PROT 6.3* 6.3* 5.8* 5.5*  ALBUMIN 2.9* 2.7* 2.6* 2.4*   No results for input(s): LIPASE, AMYLASE in the last 168 hours. No results for input(s): AMMONIA in the last 168 hours. Coagulation Profile: No results for input(s): INR, PROTIME in the last 168 hours. Cardiac Enzymes: No results for input(s): CKTOTAL, CKMB, CKMBINDEX, TROPONINI in the last 168 hours. BNP (last 3 results) No results for input(s): PROBNP in the last 8760 hours. HbA1C: No results for input(s): HGBA1C in the last 72 hours. CBG: No results for input(s): GLUCAP in the last 168 hours. Lipid Profile: No results for input(s): CHOL, HDL, LDLCALC, TRIG, CHOLHDL, LDLDIRECT in the last 72  hours. Thyroid Function Tests: No results for input(s): TSH, T4TOTAL, FREET4, T3FREE, THYROIDAB in the last 72 hours. Anemia Panel: No results for input(s): VITAMINB12, FOLATE, FERRITIN, TIBC, IRON, RETICCTPCT in the last 72 hours. Sepsis Labs: Recent Labs  Lab 05/06/20 1730 05/10/20 0424  PROCALCITON  --  30.18  LATICACIDVEN 1.1  --     Recent Results (from the past 240 hour(s))  SARS Coronavirus 2 by RT PCR (hospital order, performed in [facility] hospital lab) Nasopharyngeal Nasopharyngeal Swab     Status: None   Collection Time: 05/06/20  4:38 PM   Specimen: Nasopharyngeal Swab  Result Value Ref Range Status   SARS Coronavirus 2 NEGATIVE NEGATIVE Final    Comment: (NOTE) SARS-CoV-2 target nucleic acids are NOT DETECTED.  The SARS-CoV-2 RNA is generally detectable in upper and lower respiratory specimens during the acute phase of infection. The lowest concentration of SARS-CoV-2 viral copies this assay can detect is 250 copies / mL. A negative result does not preclude SARS-CoV-2 infection and should not be used as the sole basis for treatment or other patient management decisions.  A negative result may occur with improper specimen collection / handling, submission of specimen other than nasopharyngeal swab, presence of viral mutation(s) within the areas targeted by this assay, and inadequate number of viral copies (<250 copies / mL). A negative result must be combined with clinical observations, patient history, and epidemiological information.  Fact Sheet for Patients:   StrictlyIdeas.no  Fact Sheet for Healthcare Providers: BankingDealers.co.za  This test is not yet approved or  cleared by the [place] FDA and has been authorized for detection and/or diagnosis of SARS-CoV-2 by FDA under an Emergency Use Authorization (EUA).  This EUA will remain in effect (meaning this test can be used) for the duration of  the COVID-19 declaration under Section 564(b)(1) of the Act, 21 U.S.C. section 360bbb-3(b)(1), unless the authorization is terminated or revoked sooner.  Performed at [facility], [address] [place], [place] [address]   Blood culture (routine x 2)     Status: None   Collection Time: 05/06/20  5:29 PM   Specimen: BLOOD  Result Value Ref Range Status   Specimen Description BLOOD RIGHT HAND  Final   Special Requests   Final    BOTTLES DRAWN AEROBIC AND ANAEROBIC Blood Culture adequate volume   Culture   Final    NO GROWTH 5 DAYS Performed at [facility], [address], [place], [place] [address]    Report Status 05/11/2020 FINAL  Final  Blood culture (routine x 2)     Status: None   Collection Time: 05/06/20  5:29 PM   Specimen: BLOOD  Result Value Ref Range Status   Specimen Description BLOOD RIGHT FOREARM  Final   Special Requests   Final    BOTTLES DRAWN AEROBIC AND ANAEROBIC Blood Culture results may not be optimal due to an excessive volume of blood received in culture bottles   Culture   Final    NO GROWTH 5 DAYS Performed at [facility], [address] [place], [place] [address]    Report Status 05/11/2020 FINAL  Final  Body fluid culture     Status: None   Collection Time: 05/08/20 11:00 AM   Specimen: PATH Cytology Pleural fluid  Result Value Ref Range Status   Specimen Description   Final    PLEURAL Performed at [facility], [address], [place], [place] [address]    Special Requests   Final    NONE Performed at [facility], [address] [place], [place] [address]    Gram Stain   Final    MODERATE WBC PRESENT, PREDOMINANTLY MONONUCLEAR NO ORGANISMS SEEN    Culture   Final    NO GROWTH 3 DAYS Performed at [facility], [place] [address], [place], [place] [address]    Report Status 05/11/2020 FINAL  Final         Radiology Studies: No results found.      Scheduled Meds: .  carbidopa-levodopa  1 tablet Oral TID  . donepezil  5 mg Oral Daily  . DULoxetine  60 mg Oral Daily  . enoxaparin (LOVENOX) injection  40 mg Subcutaneous Q24H  . feeding supplement (ENSURE ENLIVE)  237 mL Oral TID BM  . loratadine  10 mg Oral Daily  . metoprolol tartrate  25 mg Oral BID  . QUEtiapine  25 mg Oral QHS  . sodium chloride flush  10 mL Intravenous Q12H   Continuous Infusions: . sodium chloride Stopped (05/09/20 2234)  . piperacillin-tazobactam 3.375 g (05/11/20 0510)     LOS: 5 days    Time spent: 28 minutes    Sharen Hones, MD [facility]   To contact the attending provider between 7A-7P or the covering provider during after hours 7P-7A, please log into the web site [email or web address] and access using universal [place] password for that web site. If you do not have the password, please call the hospital operator.  05/11/2020, 10:45 AM

## 2020-05-11 NOTE — Progress Notes (Signed)
I spoke with patient's son by phone.  Son remains committed to continued medical care in the hospital and treatment of the PNA.  He says that he does not anticipate a full recovery but repeatedly asked that patient continue to "get what he needs."  Family does not seem ready or willing to accept a transition to less aggressive care.  We did discuss the option of hospice either at home or a residential hospice facility.  Family said taking him home is not an option and they are not currently interested in him being somewhere that "just keeps him comfortable."  Alternatively, rehab might be the only option, although I explained that I do not think the patient would be a good rehab candidate and would be at extremely high risk for rehospitalization.  Family are still considering options.  Time Total: 10 minutes  Visit consisted of counseling and education dealing with the complex and emotionally intense issues of symptom management and palliative care in the setting of serious and potentially life-threatening illness.Greater than 50%  of this time was spent counseling and coordinating care related to the above assessment and plan.  Signed by: Altha Harm, PhD, NP-C

## 2020-05-11 NOTE — Progress Notes (Signed)
   05/11/20 0333  Assess: MEWS Score  ECG Heart Rate (!) 110  Resp 22  Assess: MEWS Score  MEWS Temp 0  MEWS Systolic 0  MEWS Pulse 1  MEWS RR 1  MEWS LOC 0  MEWS Score 2  MEWS Score Color Yellow  Assess: if the MEWS score is Yellow or Red  Were vital signs taken at a resting state? No (Pt cont. to be agitated, HR improved but still sl. elevated)  Early Detection of Sepsis Score *See Row Information* Low  MEWS guidelines implemented *See Row Information* No, vital signs rechecked (will recheck HR)  Treat  Pain Scale PAINAD  Pain Score 5  Breathing 1  Negative Vocalization 1  Facial Expression 1  Body Language 1  Consolability 1  PAINAD Score 5

## 2020-05-11 NOTE — Progress Notes (Signed)
°   05/11/20 0500  Assess: MEWS Score  ECG Heart Rate (!) 124  Resp 23  Assess: MEWS Score  MEWS Temp 0  MEWS Systolic 0  MEWS Pulse 2  MEWS RR 1  MEWS LOC 0  MEWS Score 3  MEWS Score Color Yellow  Assess: if the MEWS score is Yellow or Red  Were vital signs taken at a resting state? No (pt agitated ang yelling out; will recheck HR)  Early Detection of Sepsis Score *See Row Information* Low  MEWS guidelines implemented *See Row Information* No, vital signs rechecked

## 2020-05-11 NOTE — Progress Notes (Addendum)
Pt HR sustaining at 130-150's. Notify Randol Kern. Scheduled metropolol 25 mg PO. Will cotninue to monitor.  Update 2227: pt HR AFIB AT 106 talked to B Randol Kern and states to monitor at this time, but notify NP when change in condition. Will continue to monitor.  Update 0411: Pt HR sustaining at 125-140. VSS. Pt resting int he bed at this time.Notify NP Randol Kern. Will continue to monitor  Update 0431: NPO Randol Kern ordered 5 mg IV metropolol and was administered. Will continue to monitor.

## 2020-05-12 DIAGNOSIS — I4891 Unspecified atrial fibrillation: Secondary | ICD-10-CM

## 2020-05-12 DIAGNOSIS — J9601 Acute respiratory failure with hypoxia: Secondary | ICD-10-CM | POA: Diagnosis not present

## 2020-05-12 LAB — BASIC METABOLIC PANEL
Anion gap: 12 (ref 5–15)
BUN: 26 mg/dL — ABNORMAL HIGH (ref 8–23)
CO2: 25 mmol/L (ref 22–32)
Calcium: 9 mg/dL (ref 8.9–10.3)
Chloride: 104 mmol/L (ref 98–111)
Creatinine, Ser: 1.67 mg/dL — ABNORMAL HIGH (ref 0.61–1.24)
GFR calc Af Amer: 41 mL/min — ABNORMAL LOW (ref >60)
GFR calc non Af Amer: 35 mL/min — ABNORMAL LOW (ref >60)
Glucose, Bld: 98 mg/dL (ref 70–99)
Potassium: 4.3 mmol/L (ref 3.5–5.1)
Sodium: 141 mmol/L (ref 135–145)

## 2020-05-12 LAB — PROCALCITONIN: Procalcitonin: 34.7 ng/mL

## 2020-05-12 LAB — MAGNESIUM: Magnesium: 2.2 mg/dL (ref 1.7–2.4)

## 2020-05-12 MED ORDER — ENOXAPARIN SODIUM 30 MG/0.3ML ~~LOC~~ SOLN: 30.0000 mg | SUBCUTANEOUS | Status: DC

## 2020-05-12 MED ORDER — LACTATED RINGERS IV SOLN: INTRAVENOUS | Status: DC

## 2020-05-12 MED ORDER — METOPROLOL TARTRATE 5 MG/5ML IV SOLN
10.0000 mg | Freq: Once | INTRAVENOUS | Status: AC
  Administered 2020-05-12: 10 mg via INTRAVENOUS
  Filled 2020-05-12: qty 10

## 2020-05-12 MED ORDER — SODIUM CHLORIDE 0.9 % IV SOLN
1.0000 g | Freq: Two times a day (BID) | INTRAVENOUS | Status: DC
  Administered 2020-05-12 – 2020-05-13 (×3): 1 g via INTRAVENOUS
  Filled 2020-05-12 (×4): qty 1

## 2020-05-12 MED ORDER — METOPROLOL TARTRATE 5 MG/5ML IV SOLN
5.0000 mg | Freq: Once | INTRAVENOUS | Status: AC
  Administered 2020-05-12: 5 mg via INTRAVENOUS
  Filled 2020-05-12: qty 5

## 2020-05-12 NOTE — Progress Notes (Signed)
Patient Name: Mike Smith Date of Encounter: 05/12/2020  Hospital Problem List     Principal Problem:   Acute respiratory failure with hypoxia [facility]) Active Problems:   Multifocal pneumonia   Mass of upper lobe of left lung   Elevated troponin   Benign essential hypertension   CRF (chronic renal failure), stage 3 (moderate)   ILD (interstitial lung disease) (HCC)   Parkinson disease (HCC)   Pleural effusion   Hepatic metastases on CT ([place])   New onset atrial fibrillation (HCC)   Palliative care encounter   Pressure injury of skin   Protein-calorie malnutrition, severe   Cancer of upper lobe of left lung ([place])    Patient Profile     86 y.o.malewith history ofParkinson's disease, dementia, interstitial lung disease not treated with oxygen, hypertension, chronic kidney disease grade 3 who was sent to the ER by his primary care physician after being noted to have oxygen saturations of 86 on room air. Patient not able to give history. He apparently had had decreased appetite and weakness. No chest pain. However troponins were drawn and noted to be 204 and 222. BNP was 232. His baseline creatinine is 1.7 and his creatinine which was 1.87 in the emergency room. Chest CT showed multifocal pneumonia and large left and small right pleural effusions. He had a left upper lobe focal mass with postobstructive collapsealong with mediastinal and bilateral hilar adenopathy. Patient was started on empiric antibiotics. Consultation requested for abnormal troponin. EKG revealed atrial fibrillation with no ischemia  Subjective   Patient currently stable but not very conversant at this time due to advanced age.  No apparent new significant changes in symptoms or issues  Inpatient Medications    . carbidopa-levodopa  1 tablet Oral TID  . donepezil  5 mg Oral Daily  . DULoxetine  60 mg Oral Daily  . enoxaparin (LOVENOX) injection  40 mg Subcutaneous Q24H  . feeding  supplement (ENSURE ENLIVE)  237 mL Oral TID BM  . loratadine  10 mg Oral Daily  . metoprolol tartrate  25 mg Oral BID  . QUEtiapine  25 mg Oral QHS  . sodium chloride flush  10 mL Intravenous Q12H    Vital Signs    Vitals:   05/12/20 0039 05/12/20 0408 05/12/20 0509 05/12/20 0547  BP: (!) 92/59 (!) 138/82  (!) 119/97  Pulse: 83   (!) 110  Resp: 18 20 22    Temp: 98.3 F (36.8 C) 97.8 F (36.6 C)    TempSrc: Axillary Axillary    SpO2: 99% 98%    Weight:      Height:        Intake/Output Summary (Last 24 hours) at 05/12/2020 0654 Last data filed at 05/12/2020 0442 Gross per 24 hour  Intake 159.71 ml  Output 1400 ml  Net -1240.29 ml   Filed Weights   05/08/20 0451 05/09/20 0623 05/11/20 0500  Weight: 71 kg 71 kg 70 kg    Physical Exam      Labs    CBC Recent Labs    05/10/20 0424  WBC 14.9*  HGB 12.7*  HCT 40.1  MCV 94.1  PLT 683   Basic Metabolic Panel Recent Labs    05/10/20 0424  NA 142  K 4.4  CL 105  CO2 22  GLUCOSE 98  BUN 27*  CREATININE 1.44*  CALCIUM 8.2*  MG 2.2   Liver Function Tests Recent Labs    05/10/20 0424  AST 31  ALT <  5  ALKPHOS 68  BILITOT 1.0  PROT 5.5*  ALBUMIN 2.4*   No results for input(s): LIPASE, AMYLASE in the last 72 hours. Cardiac Enzymes No results for input(s): CKTOTAL, CKMB, CKMBINDEX, TROPONINI in the last 72 hours. BNP No results for input(s): BNP in the last 72 hours. D-Dimer No results for input(s): DDIMER in the last 72 hours. Hemoglobin A1C No results for input(s): HGBA1C in the last 72 hours. Fasting Lipid Panel No results for input(s): CHOL, HDL, LDLCALC, TRIG, CHOLHDL, LDLDIRECT in the last 72 hours. Thyroid Function Tests No results for input(s): TSH, T4TOTAL, T3FREE, THYROIDAB in the last 72 hours.  Invalid input(s): FREET3  Telemetry    afib with variable vr with occasional rvr.  ECG    afib  Radiology    DG Chest 1 View  Result Date: 05/06/2020 CLINICAL DATA:  86 year old  male with history of shortness of breath and dyspnea. EXAM: CHEST  1 VIEW COMPARISON:  Chest x-ray 08/23/2014. FINDINGS: Multifocal airspace consolidation and interstitial prominence scattered asymmetrically throughout the lungs bilaterally, most confluent in the left upper lobe medially. Small left pleural effusion. No definite pneumothorax. Pulmonary vasculature is largely obscured. Heart size is normal. IMPRESSION: 1. Findings are concerning for severe multilobar bilateral pneumonia (left greater than right). The possibility of a centrally obstructing mass in the left upper lobe should be considered. Further evaluation with contrast enhanced chest CT could better evaluate these findings if clinically appropriate. Electronically Signed   By: Vinnie Langton M.D.   On: 05/06/2020 17:03   CT Chest W Contrast  Result Date: 05/06/2020 CLINICAL DATA:  Chest pain, dyspnea, episodic apnea EXAM: CT CHEST WITH CONTRAST TECHNIQUE: Multidetector CT imaging of the chest was performed during intravenous contrast administration. CONTRAST:  14mL OMNIPAQUE IOHEXOL 300 MG/ML  SOLN COMPARISON:  None. FINDINGS: Cardiovascular: Global cardiac size within normal limits. Moderate calcification of the aortic valve leaflets. Extensive coronary artery calcification largely within the right coronary artery. No pericardial effusions. Moderate atherosclerotic calcification within the thoracic aorta without evidence of aneurysm. Central pulmonary arteries are of normal caliber. Mediastinum/Nodes: There is extensive pathologic hilar and mediastinal adenopathy. Probable pathologic bilateral supraclavicular adenopathy. Index subcarinal lymph node measures 3.0 x 4.1 cm at axial image # 72/2 Lungs/Pleura: Moderate centrilobular emphysema. There is, superimposed, extensive multifocal airspace infiltrate within the left upper lobe, primarily, but scattered within the right lower lobe and right middle lobe in keeping with changes of multifocal  pneumonia. However, there is, superimposed, a suspected mass within the left upper lobe more peripherally, poorly visualized due to adjacent infiltrate and peripherally collapsed left upper lobe, but roughly measuring 3.6 x 3.7 x 5.1 cm on coronal image # 51/5 and sagittal image # 121/6. There is an associated large left pleural effusion and it is unclear whether this represents a parapneumonic effusion or malignant effusion. Small right pleural effusion. There are innumerable scattered pulmonary nodules predominantly within the lung bases which are indeterminate, but are suspicious for intrapulmonary metastases. Upper Abdomen: Multiple nodules are seen within the a drawing glands bilaterally which are not well characterized on this examination. There are multiple low-attenuation lesions seen within the liver suspicious for hepatic metastases given the additional findings. Musculoskeletal: Remote compression fracture of T11. No acute bone abnormality. No lytic or blastic bone lesion identified. IMPRESSION: Multifocal pulmonary infiltrates most in keeping with multifocal pneumonia in the acute setting. Large left and small right pleural effusions are noted. Left pleural effusion is indeterminate and may represent either a parapneumonic or  malignant effusion. Focal mass within the left upper lobe with obliteration of the a bronchi and postobstructive collapse of the more peripheral left upper lobe, suspicious for a primary bronchogenic neoplasm. Suspected pathologic bilateral supraclavicular, mediastinal, and bilateral hilar adenopathy. Suspected hepatic metastases. Indeterminate adrenal nodules bilaterally. Repeat staging CT or PET-CT imaging could be performed once the patient's acute issues have resolved. Emphysema. Aortic Atherosclerosis (ICD10-I70.0) and Emphysema (ICD10-J43.9). Electronically Signed   By: Fidela Salisbury MD   On: 05/06/2020 18:46   DG Chest Port 1 View  Result Date: 05/08/2020 CLINICAL DATA:   Status post thoracentesis EXAM: PORTABLE CHEST 1 VIEW COMPARISON:  May 06, 2020 chest radiograph and chest CT. FINDINGS: No appreciable pneumothorax. Left pleural effusion smaller after thoracentesis. There is there is again noted multifocal airspace opacity bilaterally, similar to 2 days prior. Heart is upper normal in size with pulmonary vascularity within normal limits. No adenopathy evident. Degenerative changes noted in the right shoulder with superior migration of the right humeral head. Note colonic interposition between the right hemidiaphragm and liver. IMPRESSION: No appreciable pneumothorax. Multifocal airspace opacity persists, likely due to multifocal pneumonia. Stable cardiac silhouette. Probable chronic rotator cuff tear on the right with superior migration of the right humeral head. Electronically Signed   By: Lowella Grip III M.D.   On: 05/08/2020 11:10   US THORACENTESIS ASP PLEURAL SPACE W/IMG GUIDE  Result Date: 05/08/2020 INDICATION: Patient with history of interstitial lung disease, CKD 3 - currently admitted multifocal pneumonia and large left pleural effusion on CT, request to IR for diagnostic and therapeutic thoracentesis. EXAM: ULTRASOUND GUIDED LEFT THORACENTESIS MEDICATIONS: 7 mL 1% lidocaine COMPLICATIONS: None immediate. PROCEDURE: An ultrasound guided thoracentesis was thoroughly discussed with the patient and questions answered. The benefits, risks, alternatives and complications were also discussed. The patient understands and wishes to proceed with the procedure. Written consent was obtained. Ultrasound was performed to localize and mark an adequate pocket of fluid in the left chest. The area was then prepped and draped in the normal sterile fashion. 1% Lidocaine was used for local anesthesia. Under ultrasound guidance a 6 Fr Safe-T-Centesis catheter was introduced. Thoracentesis was performed. The catheter was removed and a dressing applied. FINDINGS: A total of  approximately 1.4 L of hazy yellow fluid was removed. Samples were sent to the laboratory as requested by the clinical team. IMPRESSION: Successful ultrasound guided left thoracentesis yielding 1.4 L of pleural fluid. Read by Candiss Norse, PA-C Electronically Signed   By: Sandi Mariscal M.D.   On: 05/08/2020 10:52    Assessment & Plan    86 year old male with history of interstitial lung disease, dementia, Parkinson's disease, chronic kidney disease who was sent to the ER by his primary care physician after noting his pulse ox was 86% on room air. Work-up showed A. fib with RVR with an elevated troponin in the 200s. Patient is a difficult historian but did not complain of chest pain.   1. Abnormal troponin-not related to acute coronary syndrome based on history, electrocardiogram and values. Patient is not a candidate for heparin. Not a candidate for invasive or noninvasive ischemic work-up given comorbidities. Would continue with his current medications including metoprolol tartrate 25 mg twice daily for A. fib rate control. Will discontinue atorvastatin given age and comorbidities. Elevated troponin likely demand ischemia and no evidence of acute coronary syndrome  2. Atrial fibrillation-likely secondary to multiple lung issues including pneumonia and probable metastatic bronchogenic carcinoma. Rapid hr appears to be due to agitation as he  responds to haldol. Will continue with metoprolol at low dose and no additional increase in dose due to concerns of hypotension  3. Respiratory failure-likely multifactorial to include bronchogenic carcinoma as well as airspace disease. Does not appear to have significant pulmonary edema at present. We will continue to follow with empiric antibiotics.  4. CKD-renal funciton improved with creatinine of  1.44 which is improved .   5. Parkinsons-continue with sinemet.   6. Agitation-continue with haldol and ativan.   7.  Okay for discharge  from the cardiac standpoint to secondary facility on current medical regimen as listed out before.  With no need for further cardiac diagnostics and/or changes in treatment.  Please call if further questions and/or need for change in treatment options

## 2020-05-12 NOTE — Progress Notes (Signed)
PROGRESS NOTE    Mike Smith  RFF:[id number] DOB: 1928/06/09 DOA: 05/06/2020 PCP: Madelyn Brunner, MD   Chief complaint.  Altered mental status.  Brief Narrative:  Mike Smith a 86 y.o.malewith medical history significant forHTN, ILD not on oxygen, Parkinson's disease, dementia, CKD 3, who presents to the emergency room with a several day history of shortness of breath, seen by his PCP at [facility] clinic, where his O2 sat was 86% on room air. CT chest showed multifocal pneumonia, large left and small right pleural effusions, left upper lobe focal mass with postobstructive collapse, mediastinal and bilateral hilar adenopathy and suspected hepatic metastases. Patient was diagnosed with multifocal pneumonia and placed on Rocephin and Zithromax. He was also seen by oncology, no aggressive treatment is recommended. He was also followed by cardiology for increased troponin and atrial fibrillation with RVR.  7/28.1.4 L of fluids was removed yesterday by thoracentesis. Initial work-up suggest malignant pleural effusion. Completed 3 days of Zithromax.Patient has profound agitation and tachycardia. Receiving Haldol. Also added 25 mg of Seroquel every evening for poor sleep at nighttime.  7/29. Patient is less agitated today. Heart rate is slightly better. Diltiazem was added yesterday which reduce the heart rate to 60s, change dose to 60 mg 3 times a day. Profound elevation of procalcitonin level, antibiotic changed to Zosyn for aspiration pneumonia.  7/30.  Patient seemed to be doing better.  Not able to give a diltiazem as patient heart rate was better and the blood pressure was soft.  Continue Zosyn for another day.  Patient has a poor p.o. intake.  7/31.  Procalcitonin level is not better on Zosyn.  Changed to meropenem after discussion with patient son.   Assessment & Plan:   Principal Problem:   Acute respiratory failure with hypoxia  (HCC) Active Problems:   Multifocal pneumonia   Mass of upper lobe of left lung   Elevated troponin   Benign essential hypertension   CRF (chronic renal failure), stage 3 (moderate)   ILD (interstitial lung disease) (HCC)   Parkinson disease (HCC)   Pleural effusion   Hepatic metastases on CT [facility])   New onset atrial fibrillation (HCC)   Palliative care encounter   Pressure injury of skin   Protein-calorie malnutrition, severe   Cancer of upper lobe of left lung ([place])  Goals of care discussion. I will have a long discussion with the patient son, Mike Smith.  Currently, patient pneumonia is not improving on Zosyn.  The cause is that the patient has recurrent aspiration.  Patient cannot get a feeding tube.  Family still want patient to be fed.  Eating will cause more aspiration.  Family still want patient be fed, still want antibiotics to be given.  Patient condition is unlikely to improve.  As a result, family is agreeable to continue treatment.  However, patient will be transferred to hospice inpatient facility when bed is available despite the fact that  patient condition may not improve.  #1.  Acute hypoxemic respiratory failure secondary to aspiration pneumonia and a malignant pleural effusion effusion. Condition unlikely to improve.  2.  Multifocal pneumonia secondary to aspiration pneumonia. After discussion with the family, antibiotics switched to meropenem, recheck procalcitonin level tomorrow.  3.  Paroxysmal atrial fibrillation with rapid ventricular response. Patient heart rate still fluctuates based on patient mental status and agitation. Seroquel was added at nighttime.  4.  Delirium with Parkinson dementia.  Metabolic encephalopathy. Continue current treatment.  5.  Left upper lobe lung  mass with liver metastasis and malignant pleural effusion. No treatment option.  6.  Elevated troponin secondary to demand ischemia.  7.  Chronic kidney disease stage IIIb.   DVT  prophylaxis:Lovenox Code Status:DNR Family Communication: Long discussion with the family as above. Disposition Plan:  Patient came from:  Anticipated d/c place:  Barriers to d/c OR conditions which need to be met to effect a safe d/c:   Consultants:  Cardiology  Procedures: Paracentesis Antimicrobials: Meropenem   Subjective: Patient is quite confused, no significant agitation today.  On oxygen, no respite distress. Does not seem to have any abdominal pain or nausea vomiting.  Poor p.o. intake.  Objective: Vitals:   05/12/20 0509 05/12/20 0547 05/12/20 0821 05/12/20 1145  BP:  (!) 119/97 (!) 139/85 111/73  Pulse:  (!) 110 (!) 128 94  Resp: _0 Temp:   98.4 F (36.9 C)   TempSrc:      SpO2:   99% 97%  Weight:      Height:        Intake/Output Summary (Last 24 hours) at 05/12/2020 1203 Last data filed at 05/12/2020 0442 Gross per 24 hour  Intake 159.71 ml  Output 1100 ml  Net -940.29 ml   Filed Weights   05/08/20 0451 05/09/20 0623 05/11/20 0500  Weight: 71 kg 71 kg 70 kg    Examination:  General exam: Appears comfortable  Respiratory system: No respiratory distress, decreased breathing sounds. Cardiovascular system: Irregular and tachycardia.  No JVD, murmurs, rubs, gallops or clicks. No pedal edema. Gastrointestinal system: Abdomen is nondistended, soft and nontender. No organomegaly or masses felt. Normal bowel sounds heard. Central nervous system: Confused. Extremities: Symmetric  Skin: No rashes, lesions or ulcers      Data Reviewed: I have personally reviewed following labs and imaging studies  CBC: Recent Labs  Lab 05/06/20 1638 05/08/20 0734 05/09/20 0331 05/10/20 0424  WBC 13.5* 14.9* 15.4* 14.9*  HGB 11.9* 12.5* 12.2* 12.7*  HCT 34.6* 38.6* 38.2* 40.1  MCV 89.4 94.1 93.2 94.1  PLT 268 299 284 638    Basic Metabolic Panel: Recent Labs  Lab 05/06/20 1638 05/08/20 0734 05/09/20 0331 05/10/20 0424 05/12/20 0824  NA 138 140 141 142 141  K 4.5 4.5 4.5 4.4 4.3  CL 104 104 105 105 104  CO2 _1 GLUCOSE 102* 79 100* 98 98  BUN 31* 29* 30* 27* 26*  CREATININE 1.87* 1.83* 1.60* 1.44* 1.67*  CALCIUM 9.2 8.8* 8.6* 8.2* 9.0  MG  --  1.9 2.2 2.2 2.2   GFR: Estimated Creatinine Clearance: 29.1 mL/min (A) (by C-G formula based on SCr of 1.67 mg/dL (H)). Liver Function Tests: Recent Labs  Lab 05/06/20 1638 05/08/20 0734 05/09/20 0331 05/10/20 0424  AST _2 ALT _3 <5  ALKPHOS 78 80 74 68  BILITOT 1.1 0.9 0.7 1.0  PROT 6.3* 6.3* 5.8* 5.5*  ALBUMIN 2.9* 2.7* 2.6* 2.4*   No results for input(s): LIPASE, AMYLASE in the last 168 hours. No results for input(s): AMMONIA in the last 168 hours. Coagulation Profile: No results for input(s): INR, PROTIME in the last 168 hours. Cardiac Enzymes: No results for input(s): CKTOTAL, CKMB, CKMBINDEX, TROPONINI in the last 168 hours. BNP (last 3 results) No results for input(s): PROBNP in the last 8760 hours. HbA1C: No results for input(s): HGBA1C in the last 72 hours. CBG: No results for input(s): GLUCAP in the last 168 hours. Lipid  Profile: No results for input(s): CHOL, HDL, LDLCALC, TRIG, CHOLHDL, LDLDIRECT in the last 72 hours. Thyroid Function Tests: No results for input(s): TSH, T4TOTAL, FREET4, T3FREE, THYROIDAB in the last 72 hours. Anemia Panel: No results for input(s): VITAMINB12, FOLATE, FERRITIN, TIBC, IRON, RETICCTPCT in the last 72 hours. Sepsis Labs: Recent Labs  Lab 05/06/20 1730 05/10/20 0424 05/12/20 0824  PROCALCITON  --  30.18 34.70  LATICACIDVEN 1.1  --   --     Recent Results (from the past 240 hour(s))  SARS Coronavirus 2 by RT PCR (hospital order, performed in [facility] hospital lab) Nasopharyngeal Nasopharyngeal Swab     Status: None   Collection Time: 05/06/20  4:38 PM    Specimen: Nasopharyngeal Swab  Result Value Ref Range Status   SARS Coronavirus 2 NEGATIVE NEGATIVE Final    Comment: (NOTE) SARS-CoV-2 target nucleic acids are NOT DETECTED.  The SARS-CoV-2 RNA is generally detectable in upper and lower respiratory specimens during the acute phase of infection. The lowest concentration of SARS-CoV-2 viral copies this assay can detect is 250 copies / mL. A negative result does not preclude SARS-CoV-2 infection and should not be used as the sole basis for treatment or other patient management decisions.  A negative result may occur with improper specimen collection / handling, submission of specimen other than nasopharyngeal swab, presence of viral mutation(s) within the areas targeted by this assay, and inadequate number of viral copies (<250 copies / mL). A negative result must be combined with clinical observations, patient history, and epidemiological information.  Fact Sheet for Patients:   StrictlyIdeas.no  Fact Sheet for Healthcare Providers: BankingDealers.co.za  This test is not yet approved or  cleared by the [place] FDA and has been authorized for detection and/or diagnosis of SARS-CoV-2 by FDA under an Emergency Use Authorization (EUA).  This EUA will remain in effect (meaning this test can be used) for the duration of the COVID-19 declaration under Section 564(b)(1) of the Act, 21 U.S.C. section 360bbb-3(b)(1), unless the authorization is terminated or revoked sooner.  Performed at [facility], [address] [place], [place] [address]   Blood culture (routine x 2)     Status: None   Collection Time: 05/06/20  5:29 PM   Specimen: BLOOD  Result Value Ref Range Status   Specimen Description BLOOD RIGHT HAND  Final   Special Requests   Final    BOTTLES DRAWN AEROBIC AND ANAEROBIC Blood Culture adequate volume   Culture   Final    NO GROWTH 5 DAYS Performed at [facility], [address] [place], [place] [address]    Report Status 05/11/2020 FINAL  Final  Blood culture (routine x 2)     Status: None   Collection Time: 05/06/20  5:29 PM   Specimen: BLOOD  Result Value Ref Range Status   Specimen Description BLOOD RIGHT FOREARM  Final   Special Requests   Final    BOTTLES DRAWN AEROBIC AND ANAEROBIC Blood Culture results may not be optimal due to an excessive volume of blood received in culture bottles   Culture   Final    NO GROWTH 5 DAYS Performed at [facility], [address] [place], [place] [address]    Report Status 05/11/2020 FINAL  Final  Body fluid culture     Status: None   Collection Time: 05/08/20 11:00 AM   Specimen: PATH Cytology Pleural fluid  Result Value Ref Range Status   Specimen Description  Final    PLEURAL Performed at [facility], [address] [place], [place] [address]    Special Requests   Final    NONE Performed at [facility], [address] [place], [place] [address]    Gram Stain   Final    MODERATE WBC PRESENT, PREDOMINANTLY MONONUCLEAR NO ORGANISMS SEEN    Culture   Final    NO GROWTH 3 DAYS Performed at [facility], [address] [place], [place] [address]    Report Status 05/11/2020 FINAL  Final         Radiology Studies: No results found.      Scheduled Meds: . carbidopa-levodopa  1 tablet Oral TID  . donepezil  5 mg Oral Daily  . DULoxetine  60 mg Oral Daily  . enoxaparin (LOVENOX) injection  40 mg Subcutaneous Q24H  . feeding supplement (ENSURE ENLIVE)  237 mL Oral TID BM  . loratadine  10 mg Oral Daily  . metoprolol tartrate  25 mg Oral BID  . QUEtiapine  25 mg Oral QHS  . sodium chloride flush  10 mL Intravenous Q12H   Continuous Infusions: . sodium chloride 10 mL (05/12/20 0539)  . lactated ringers    . meropenem (MERREM) IV       LOS: 6 days    Time spent: 36 minutes    Sharen Hones, MD [facility]   To contact the attending provider between 7A-7P or the covering provider during after hours 7P-7A, please log into the web site [email or web address] and access using universal [place] password for that web site. If you do not have the password, please call the hospital operator.  05/12/2020, 12:03 PM

## 2020-05-12 NOTE — Progress Notes (Signed)
Mike Smith   DOB:02-Sep-1928   MW#:[id number]    Subjective: Patient continues to have episodes of tachycardia; overnight.  Otherwise no worsening respiratory issues.  Patient nonverbal; confused.  Objective:  Vitals:   05/12/20 0821 05/12/20 1145  BP: (!) 139/85 111/73  Pulse: (!) 128 94  Resp: 18 18  Temp: 98.4 F (36.9 C)   SpO2: 99% 97%     Intake/Output Summary (Last 24 hours) at 05/12/2020 1250 Last data filed at 05/12/2020 0442 Gross per 24 hour  Intake 159.71 ml  Output 1100 ml  Net -940.29 ml    Physical Exam Constitutional:      Comments: Pt is cachetic; resting comfortably in bed; no family by the bedside.    HENT:     Head: Normocephalic and atraumatic.     Mouth/Throat:     Pharynx: No oropharyngeal exudate.  Eyes:     Pupils: Pupils are equal, round, and reactive to light.  Cardiovascular:     Comments: Irregular irregular rhythm Pulmonary:     Effort: No respiratory distress.     Breath sounds: No wheezing.     Comments: Decreased breath sounds.  Abdominal:     General: Bowel sounds are normal. There is no distension.     Palpations: Abdomen is soft. There is no mass.     Tenderness: There is no abdominal tenderness. There is no guarding or rebound.  Musculoskeletal:        General: No tenderness. Normal range of motion.     Cervical back: Normal range of motion and neck supple.  Skin:    General: Skin is warm.  Neurological:     Mental Status: He is alert.     Comments: Patient awake.  Not following any verbal commands.  Confused.  Psychiatric:        Mood and Affect: Affect normal.      Labs:  Lab Results  Component Value Date   WBC 14.9 (H) 05/10/2020   HGB 12.7 (L) 05/10/2020   HCT 40.1 05/10/2020   MCV 94.1 05/10/2020   PLT 246 05/10/2020   NEUTROABS 4.5 08/23/2014    Lab Results  Component Value Date   NA 141 05/12/2020   K 4.3 05/12/2020   CL 104 05/12/2020   CO2 25 05/12/2020    Studies:  No results  found.  Mass of upper lobe of left lung     Cancer of upper lobe of left lung [facility]) # 86 year old male patient with multiple medical problems including Parkinson's disease dementia; chronic kidney disease; COPD/long-term history of smoking-currently admitted to hospital for worsening shortness of breath cough  #Left upper lobe lung cancer/pleural effusion stage IV; non-small cell/adenocarcinoma.  #Acute hypoxic respiratory failure -~3lits  of oxygen-CT scan left upper lobe mass/moderate left pleural effusion; bilateral pneumonia-currently on IV antibiotics-mild improvement noted.  # CKD/A. fib with RVR-poorly controlled-defer to primary service.    #Mental status changes-metabolic encephalopathy/medication induced-slightly better; however still confused.  # Prognosis: DNR/DNI-unfortunately continues with poor. Continue IV antibiotics/medical management; when clinically stable discharge/hospice.  Unfortunately clinically would not expect significant improvement overall. Discussed with Dr. Roosevelt Locks.    Cammie Sickle, MD 05/12/2020  12:50 PM

## 2020-05-12 NOTE — Progress Notes (Signed)
PHARMACIST - PHYSICIAN COMMUNICATION  CONCERNING:  Enoxaparin (Lovenox) for DVT Prophylaxis    RECOMMENDATION: Patient was prescribed enoxaprin 40mg  q24 hours for VTE prophylaxis.   Filed Weights   05/08/20 0451 05/09/20 0623 05/11/20 0500  Weight: 71 kg (156 lb 8 oz) 71 kg (156 lb 8.4 oz) 70 kg (154 lb 5.2 oz)    Body mass index is 20.36 kg/m.  Estimated Creatinine Clearance: 29.1 mL/min (A) (by C-G formula based on SCr of 1.67 mg/dL (H)).  Patient is candidate for enoxaparin 30mg  every 24 hours based on CrCl <31ml/min  DESCRIPTION: Pharmacy has adjusted enoxaparin dose per [facility]/[place] policy.  Patient is now receiving enoxaparin 30mg  every 24 hours.  Pernell Dupre, PharmD, BCPS Clinical Pharmacist 05/12/2020 2:28 PM

## 2020-05-12 NOTE — Progress Notes (Signed)
PHARMACY NOTE:  ANTIMICROBIAL RENAL DOSAGE ADJUSTMENT  Current antimicrobial regimen includes a mismatch between antimicrobial dosage and estimated renal function.  As per policy approved by the Pharmacy & Therapeutics and Medical Executive Committees, the antimicrobial dosage will be adjusted accordingly.  Current antimicrobial dosage:  Meropenem 500mg  IV every 8 hours   Indication: aspiration PNA   Renal Function:  Estimated Creatinine Clearance: 29.1 mL/min (A) (by C-G formula based on SCr of 1.67 mg/dL (H)).     Antimicrobial dosage has been changed to:  Meropenem 1g IV every 12 hours   Thank you for allowing pharmacy to be a part of this patient's care.  Pernell Dupre, PharmD, BCPS Clinical Pharmacist 05/12/2020 11:49 AM

## 2020-05-12 NOTE — Progress Notes (Addendum)
Pt HR at 154 sustaining. Pt is asymptomatic. Notify NP Morrsion. Will continue to monitor.  Update 2205: NP Morrision ordered metropolol 10 mg IV once. IV Metropolol was given. Will continue to monitor.  Update 0159: Pt HR sustaining from 130-150's. Pt was also trying to get up the bed. Notify NP Randol Kern. Will continue to monitor.  Update 0208: NP Morisson order PRN IV Haldol 2 mg for agitation and IV metropolol 10 mg IV for HR > 130 every 6 hours. Will continue to monitor.  Update 0526: Son was concern about feeding pt because of what was mentioned to son yesterday afternoon about pt having an aspiration PNA. Notify NP Morisson. Will continue to monitor.

## 2020-05-13 LAB — BASIC METABOLIC PANEL
Anion gap: 11 (ref 5–15)
BUN: 26 mg/dL — ABNORMAL HIGH (ref 8–23)
CO2: 25 mmol/L (ref 22–32)
Calcium: 8.7 mg/dL — ABNORMAL LOW (ref 8.9–10.3)
Chloride: 106 mmol/L (ref 98–111)
Creatinine, Ser: 1.62 mg/dL — ABNORMAL HIGH (ref 0.61–1.24)
GFR calc Af Amer: 43 mL/min — ABNORMAL LOW (ref >60)
GFR calc non Af Amer: 37 mL/min — ABNORMAL LOW (ref >60)
Glucose, Bld: 83 mg/dL (ref 70–99)
Potassium: 4.3 mmol/L (ref 3.5–5.1)
Sodium: 142 mmol/L (ref 135–145)

## 2020-05-13 LAB — PROCALCITONIN: Procalcitonin: 37.19 ng/mL

## 2020-05-13 LAB — GLUCOSE, CAPILLARY: Glucose-Capillary: 77 mg/dL (ref 70–99)

## 2020-05-13 MED ORDER — METOPROLOL TARTRATE 5 MG/5ML IV SOLN: 10.0000 mg | INTRAVENOUS | Status: DC | PRN

## 2020-05-13 MED ORDER — HALOPERIDOL LACTATE 5 MG/ML IJ SOLN
2.0000 mg | Freq: Four times a day (QID) | INTRAMUSCULAR | Status: DC | PRN
  Administered 2020-05-13: 2 mg via INTRAVENOUS
  Filled 2020-05-13: qty 1

## 2020-05-13 MED ORDER — AMOXICILLIN-POT CLAVULANATE 400-57 MG/5ML PO SUSR
800.0000 mg | Freq: Two times a day (BID) | ORAL | Status: DC
  Administered 2020-05-13 (×2): 800 mg via ORAL
  Filled 2020-05-13 (×9): qty 10

## 2020-05-13 MED ORDER — METOPROLOL TARTRATE 5 MG/5ML IV SOLN
10.0000 mg | Freq: Four times a day (QID) | INTRAVENOUS | Status: DC | PRN
  Administered 2020-05-13: 10 mg via INTRAVENOUS
  Filled 2020-05-13: qty 10

## 2020-05-13 NOTE — Progress Notes (Addendum)
Per day shift nurse MD Roosevelt Locks states that Peripheral IV is not needed for pt. Pt is to transferred to hospice inpt care once bed is available. Will continue to monitor.  Update 2027: Pt HR up in 130-140 sustaining. NP Randol Kern made aware and talked to pt son Mike Smith. Pt son states was not aware that IV was out and requested to put a new one in. IV team consult placed. Will continue to monitor.  Update 2130: IV team informed nurse that pt sons refused the peripheral IV for pt. Sons made aware that pt HR elevated at 140-160's. Son is aware that pt is just waiting for hospice bed and opted not to treat at this time. NP Randol Kern made aware.Will continue to monitor.

## 2020-05-13 NOTE — Progress Notes (Signed)
PROGRESS NOTE    Mike Smith  PJK:[id number] DOB: 07/23/1928 DOA: 05/06/2020 PCP: Madelyn Brunner, MD   Chief complaint.  Shortness of breath.   Brief Narrative:  Mike Smith a 86 y.o.malewith medical history significant forHTN, ILD not on oxygen, Parkinson's disease, dementia, CKD 3, who presents to the emergency room with a several day history of shortness of breath, seen by his PCP at [facility] clinic, where his O2 sat was 86% on room air. CT chest showed multifocal pneumonia, large left and small right pleural effusions, left upper lobe focal mass with postobstructive collapse, mediastinal and bilateral hilar adenopathy and suspected hepatic metastases. Patient was diagnosed with multifocal pneumonia and placed on Rocephin and Zithromax. He was also seen by oncology, no aggressive treatment is recommended. He was also followed by cardiology for increased troponin and atrial fibrillation with RVR.  7/28.1.4 L of fluids was removed yesterday by thoracentesis. Initial work-up suggest malignant pleural effusion. Completed 3 days of Zithromax.Patient has profound agitation and tachycardia. Receiving Haldol. Also added 25 mg of Seroquel every evening for poor sleep at nighttime.  7/29. Patient is less agitated today. Heart rate is slightly better. Diltiazem was added yesterday which reduce the heart rate to 60s, change dose to 60 mg 3 times a day. Profound elevation of procalcitonin level, antibiotic changed to Zosyn for aspiration pneumonia.  7/30. Patient seemed to be doing better. Not able to give a diltiazem as patient heart rate was better and the blood pressure was soft. Continue Zosyn for another day. Patient has a poor p.o. intake.  7/31.  Procalcitonin level is not better on Zosyn.  Changed to meropenem after discussion with patient son.  8/1.  Discussed with patient daughter, patient treatment is futile.  His life expectancy  probably is less than 1 week.     Assessment & Plan:   Principal Problem:   Acute respiratory failure with hypoxia (HCC) Active Problems:   Multifocal pneumonia   Mass of upper lobe of left lung   Elevated troponin   Benign essential hypertension   CRF (chronic renal failure), stage 3 (moderate)   ILD (interstitial lung disease) (HCC)   Parkinson disease (HCC)   Pleural effusion   Hepatic metastases on [facility])   New onset atrial fibrillation (HCC)   Palliative care encounter   Pressure injury of skin   Protein-calorie malnutrition, severe   Cancer of upper lobe of left lung ([place])  Goal of care discussion: Another conversation with patient daughter.  Patient pneumonia is not getting better with meropenem, procalcitonin level continue going up.  He has a constant aspiration.  Family still want feeding him.  There is no option of getting patient better.  After long discussion, decision was made to treat him with oral Augmentin and to continue let him eating.  Patient will be transferred to hospice inpatient care when bed available.  Patient life expectancy is probably less than 1 week.  #1.  Acute hypoxemic respiratory failure. Patient condition appears to be terminal.  2.  Multifocal pneumonia secondary to recurrent aspiration pneumonia. Continue Augmentin per family request.  3.  Paroxysmal atrial fibrillation with rapid major response. Continue Seroquel.  Patient condition appears to be secondary to agitation.  4.  Metabolic cephalopathy, delirium with Parkinson dementia. Continue current treatment.  5.  Left upper lobe lung mass with liver metastasis and a malignant pleural effusion. Not treatment option.  6.  Elevated troponin secondary to demand ischemia.  7.  Chronic kidney  disease stage IIIb.     Subjective: Patient is unresponsive, could not clear upper airway.  With occasional shortness of breath.  Does not have a cough reflex No fever or  chills.  Objective: Vitals:   05/13/20 0305 05/13/20 0730 05/13/20 0805 05/13/20 1128  BP: (!) 133/89 112/72 (!) 148/95 (!) 130/90  Pulse: (!) 110 95 (!) 124 (!) 109  Resp: 22 19 20 17   Temp: 97.8 F (36.6 C) 98.5 F (36.9 C) 97.7 F (36.5 C) 97.6 F (36.4 C)  TempSrc: Oral Oral Oral   SpO2: 96% 95% 95% 90%  Weight:      Height:        Intake/Output Summary (Last 24 hours) at 05/13/2020 1221 Last data filed at 05/13/2020 1025 Gross per 24 hour  Intake 488.27 ml  Output 1300 ml  Net -811.73 ml   Filed Weights   05/08/20 0451 05/09/20 0623 05/11/20 0500  Weight: 71 kg 71 kg 70 kg    Examination:  General exam: Ill appearing Respiratory system: Diffuse rhonchi.  Cardiovascular system: Irregular with tachycardia Gastrointestinal system: Abdomen is nondistended, soft and nontender.Normal bowel sounds heard. Central nervous system: Unresponsive Extremities: No edema    Data Reviewed: I have personally reviewed following labs and imaging studies  CBC: Recent Labs  Lab 05/06/20 1638 05/08/20 0734 05/09/20 0331 05/10/20 0424  WBC 13.5* 14.9* 15.4* 14.9*  HGB 11.9* 12.5* 12.2* 12.7*  HCT 34.6* 38.6* 38.2* 40.1  MCV 89.4 94.1 93.2 94.1  PLT 268 299 284 569   Basic Metabolic Panel: Recent Labs  Lab 05/08/20 0734 05/09/20 0331 05/10/20 0424 05/12/20 0824 05/13/20 0028  NA 140 141 142 141 142  K 4.5 4.5 4.4 4.3 4.3  CL 104 105 105 104 106  CO2 25 27 22 25 25   GLUCOSE 79 100* 98 98 83  BUN 29* 30* 27* 26* 26*  CREATININE 1.83* 1.60* 1.44* 1.67* 1.62*  CALCIUM 8.8* 8.6* 8.2* 9.0 8.7*  MG 1.9 2.2 2.2 2.2  --    GFR: Estimated Creatinine Clearance: 30 mL/min (A) (by C-G formula based on SCr of 1.62 mg/dL (H)). Liver Function Tests: Recent Labs  Lab 05/06/20 1638 05/08/20 0734 05/09/20 0331 05/10/20 0424  AST 31 28 25 31   ALT 6 21 9  <5  ALKPHOS 78 80 74 68  BILITOT 1.1 0.9 0.7 1.0  PROT 6.3* 6.3* 5.8* 5.5*  ALBUMIN 2.9* 2.7* 2.6* 2.4*   No results  for input(s): LIPASE, AMYLASE in the last 168 hours. No results for input(s): AMMONIA in the last 168 hours. Coagulation Profile: No results for input(s): INR, PROTIME in the last 168 hours. Cardiac Enzymes: No results for input(s): CKTOTAL, CKMB, CKMBINDEX, TROPONINI in the last 168 hours. BNP (last 3 results) No results for input(s): PROBNP in the last 8760 hours. HbA1C: No results for input(s): HGBA1C in the last 72 hours. CBG: Recent Labs  Lab 05/13/20 0541  GLUCAP 77   Lipid Profile: No results for input(s): CHOL, HDL, LDLCALC, TRIG, CHOLHDL, LDLDIRECT in the last 72 hours. Thyroid Function Tests: No results for input(s): TSH, T4TOTAL, FREET4, T3FREE, THYROIDAB in the last 72 hours. Anemia Panel: No results for input(s): VITAMINB12, FOLATE, FERRITIN, TIBC, IRON, RETICCTPCT in the last 72 hours. Sepsis Labs: Recent Labs  Lab 05/06/20 1730 05/10/20 0424 05/12/20 0824 05/13/20 0028  PROCALCITON  --  30.18 34.70 37.19  LATICACIDVEN 1.1  --   --   --     Recent Results (from the past 240 hour(s))  SARS Coronavirus 2 by RT PCR (hospital order, performed in [facility] hospital lab) Nasopharyngeal Nasopharyngeal Swab     Status: None   Collection Time: 05/06/20  4:38 PM   Specimen: Nasopharyngeal Swab  Result Value Ref Range Status   SARS Coronavirus 2 NEGATIVE NEGATIVE Final    Comment: (NOTE) SARS-CoV-2 target nucleic acids are NOT DETECTED.  The SARS-CoV-2 RNA is generally detectable in upper and lower respiratory specimens during the acute phase of infection. The lowest concentration of SARS-CoV-2 viral copies this assay can detect is 250 copies / mL. A negative result does not preclude SARS-CoV-2 infection and should not be used as the sole basis for treatment or other patient management decisions.  A negative result may occur with improper specimen collection / handling, submission of specimen other than nasopharyngeal swab, presence of viral mutation(s) within  the areas targeted by this assay, and inadequate number of viral copies (<250 copies / mL). A negative result must be combined with clinical observations, patient history, and epidemiological information.  Fact Sheet for Patients:   StrictlyIdeas.no  Fact Sheet for Healthcare Providers: BankingDealers.co.za  This test is not yet approved or  cleared by the [place] FDA and has been authorized for detection and/or diagnosis of SARS-CoV-2 by FDA under an Emergency Use Authorization (EUA).  This EUA will remain in effect (meaning this test can be used) for the duration of the COVID-19 declaration under Section 564(b)(1) of the Act, 21 U.S.C. section 360bbb-3(b)(1), unless the authorization is terminated or revoked sooner.  Performed at [facility], [address] [place], [place] [address]   Blood culture (routine x 2)     Status: None   Collection Time: 05/06/20  5:29 PM   Specimen: BLOOD  Result Value Ref Range Status   Specimen Description BLOOD RIGHT HAND  Final   Special Requests   Final    BOTTLES DRAWN AEROBIC AND ANAEROBIC Blood Culture adequate volume   Culture   Final    NO GROWTH 5 DAYS Performed at [facility], [address] [place], [place] [address]    Report Status 05/11/2020 FINAL  Final  Blood culture (routine x 2)     Status: None   Collection Time: 05/06/20  5:29 PM   Specimen: BLOOD  Result Value Ref Range Status   Specimen Description BLOOD RIGHT FOREARM  Final   Special Requests   Final    BOTTLES DRAWN AEROBIC AND ANAEROBIC Blood Culture results may not be optimal due to an excessive volume of blood received in culture bottles   Culture   Final    NO GROWTH 5 DAYS Performed at [facility], [address], [place], [place] [address]    Report Status 05/11/2020 FINAL  Final  Body fluid culture     Status: None   Collection Time: 05/08/20 11:00 AM   Specimen: PATH  Cytology Pleural fluid  Result Value Ref Range Status   Specimen Description   Final    PLEURAL Performed at [facility], [address] [place], [place] [address]    Special Requests   Final    NONE Performed at [facility], [address] [place],  [address]    Gram Stain   Final    MODERATE WBC PRESENT, PREDOMINANTLY MONONUCLEAR NO ORGANISMS SEEN    Culture   Final    NO GROWTH 3 DAYS Performed at [facility], [place] [address], [place],  [address]  Report Status 05/11/2020 FINAL  Final         Radiology Studies: No results found.      Scheduled Meds: . amoxicillin-clavulanate  800 mg Oral Q12H  . feeding supplement (ENSURE ENLIVE)  237 mL Oral TID BM  . QUEtiapine  25 mg Oral QHS  . sodium chloride flush  10 mL Intravenous Q12H   Continuous Infusions: . sodium chloride 10 mL (05/12/20 2143)     LOS: 7 days    Time spent: 35 minutes, more than half of the time spent on direct patient care.    Sharen Hones, MD [facility]   To contact the attending provider between 7A-7P or the covering provider during after hours 7P-7A, please log into the web site [email or web address] and access using universal [place] password for that web site. If you do not have the password, please call the hospital operator.  05/13/2020, 12:21 PM

## 2020-05-13 NOTE — Progress Notes (Addendum)
Cross Cover Brief Note  Patient with tachycardia rates going up into 160's at times. Ranging 130-160.  Patient without IV access to treat with IV neds Discussed with son at bedside.  He wishes for IV to be restarted and heart rate treated. IV team called to start IV.  Will treat with IV metoprolol once IV started Addendum When IV team came to place IV son has now changed his mind and does not want IV for med to treat heart rate.  Will d/c order for IV and meds. D/C tele monitoring

## 2020-05-13 NOTE — Plan of Care (Signed)
  Problem: Safety: Goal: Ability to remain free from injury will improve Outcome: Progressing   Problem: Respiratory: Goal: Ability to maintain adequate ventilation will improve Outcome: Progressing

## 2020-05-14 DIAGNOSIS — C787 Secondary malignant neoplasm of liver and intrahepatic bile duct: Secondary | ICD-10-CM

## 2020-05-14 DIAGNOSIS — J910 Malignant pleural effusion: Secondary | ICD-10-CM

## 2020-05-14 MED ORDER — MORPHINE SULFATE (PF) 2 MG/ML IV SOLN
2.0000 mg | Freq: Once | INTRAVENOUS | Status: AC
  Administered 2020-05-14: 2 mg via SUBCUTANEOUS
  Filled 2020-05-14: qty 1

## 2020-05-14 MED ORDER — MORPHINE SULFATE (CONCENTRATE) 10 MG/0.5ML PO SOLN
5.0000 mg | ORAL | Status: DC | PRN
  Administered 2020-05-14 – 2020-05-15 (×3): 5 mg via ORAL
  Filled 2020-05-14 (×3): qty 0.5

## 2020-05-14 MED ORDER — HALOPERIDOL LACTATE 2 MG/ML PO CONC
1.0000 mg | Freq: Four times a day (QID) | ORAL | Status: DC | PRN
  Filled 2020-05-14: qty 0.5

## 2020-05-14 NOTE — Care Management Important Message (Signed)
Important Message  Patient Details  Name: Mike Smith MRN: [id number] Date of Birth: [birth year]   Medicare Important Message Given:  Yes     [name] 05/14/2020, 2:43 PM

## 2020-05-14 NOTE — Progress Notes (Signed)
[place] paged to 2A by RN on behalf of family; when [facility] arrived at rm., pt.'s son Elta Guadeloupe at bedside.  Son shared he and pt.'s sister are interested in having [place] priest come administer the Anointing of the Sick for Pt.  CH will call [facility] tomorrow AM and try to coordinate priest's visit w/family presence in rm.  Son shared pt. had been brought to [facility] one week ago w/difficulty breathing; scans revealed pneumonia as well as cancer; over the last week, son and dtr. have been processing the information offered by medical team re: pt.'s prognosis and have come to accept that he will not be coming home.  Son is in the midst of beginning a new job and appeared somewhat overwhelmed but accepting of situation. Pt. lying down in bed, calling out intermittently watching TV; midway through visit, pt. grew wide-eyed and appeared to be in distress or pain; indicated to son that his chest hurt --> RN called to address situation.  [place] plans to follow up w/son tomorrow AM and will also pass referral on to daytime chaplain.

## 2020-05-14 NOTE — Progress Notes (Signed)
Patient begin complaining of chest pain. Received oral morphine 30 minutes prior. Discussed with Sharion Settler NP. Orders were placed for subcutaneous morphine. VSS. Patient is now asleep after administration.

## 2020-05-14 NOTE — Plan of Care (Signed)
  Problem: Clinical Measurements: Goal: Respiratory complications will improve Outcome: Progressing   Problem: Safety: Goal: Ability to remain free from injury will improve Outcome: Progressing   

## 2020-05-14 NOTE — TOC Progression Note (Signed)
Transition of Care [facility]) - Progression Note    Patient Details  Name: Mike Smith MRN: [id number] Date of Birth: Aug 07, 1928  Transition of Care [facility]) CM/SW [place], LCSW Phone Number: 05/14/2020, 3:39 PM  Clinical Narrative:  Hospice referral made yesterday to [facility]. CSW confirmed there is no bed available today.          Expected Discharge Plan and Services                                                 Social Determinants of Health (SDOH) Interventions    Readmission Risk Interventions No flowsheet data found.

## 2020-05-14 NOTE — Progress Notes (Signed)
PROGRESS NOTE    Mike Smith  QQV:[id number] DOB: 01/10/1928 DOA: 05/06/2020 PCP: Madelyn Brunner, MD   Chief complaint.  Altered mental status.   Brief Narrative:  Rohith Fauth Franklinis a 86 y.o.malewith medical history significant forHTN, ILD not on oxygen, Parkinson's disease, dementia, CKD 3, who presents to the emergency room with a several day history of shortness of breath, seen by his PCP at [facility] clinic, where his O2 sat was 86% on room air. CT chest showed multifocal pneumonia, large left and small right pleural effusions, left upper lobe focal mass with postobstructive collapse, mediastinal and bilateral hilar adenopathy and suspected hepatic metastases. Patient was diagnosed with multifocal pneumonia and placed on Rocephin and Zithromax. He was also seen by oncology, no aggressive treatment is recommended. He was also followed by cardiology for increased troponin and atrial fibrillation with RVR.  7/28.1.4 L of fluids was removed yesterday by thoracentesis. Initial work-up suggest malignant pleural effusion. Completed 3 days of Zithromax.Patient has profound agitation and tachycardia. Receiving Haldol. Also added 25 mg of Seroquel every evening for poor sleep at nighttime.  7/29. Patient is less agitated today. Heart rate is slightly better. Diltiazem was added yesterday which reduce the heart rate to 60s, change dose to 60 mg 3 times a day. Profound elevation of procalcitonin level, antibiotic changed to Zosyn for aspiration pneumonia.  7/30. Patient seemed to be doing better. Not able to give a diltiazem as patient heart rate was better and the blood pressure was soft. Continue Zosyn for another day. Patient has a poor p.o. intake.  7/31.Procalcitonin level is not better on Zosyn. Changed to meropenem after discussion with patient son.  8/1.  Discussed with patient daughter, patient treatment is futile.  His life expectancy  probably is less than 1 week.    Assessment & Plan:   Principal Problem:   Acute respiratory failure with hypoxia (HCC) Active Problems:   Multifocal pneumonia   Mass of upper lobe of left lung   Elevated troponin   Benign essential hypertension   CRF (chronic renal failure), stage 3 (moderate)   ILD (interstitial lung disease) (HCC)   Parkinson disease (HCC)   Pleural effusion   Hepatic metastases on CT [facility])   New onset atrial fibrillation (HCC)   Palliative care encounter   Pressure injury of skin   Protein-calorie malnutrition, severe   Cancer of upper lobe of left lung [facility])  Patient is a pending inpatient hospice transfer.  Currently, patient is allowed to eat, is also on oral Augmentin.  But her condition is terminal, and like to survive more than 1 week.  Continue current treatment.  Subjective: Patient is unresponsive, very little p.o. intake.  With occasional agitation and tachycardia.  Objective: Vitals:   05/14/20 0000 05/14/20 0430 05/14/20 0744 05/14/20 1132  BP: (!) 130/75 (!) 137/80 (!) 147/67 (!) 146/95  Pulse: 105 (!) 110 102 (!) 101  Resp: 22 20 18 18   Temp: 98 F (36.7 C) 97.6 F (36.4 C) 98.1 F (36.7 C) 97.9 F (36.6 C)  TempSrc: Oral Oral Axillary Axillary  SpO2: 94% 93% 95% 96%  Weight:      Height:        Intake/Output Summary (Last 24 hours) at 05/14/2020 1241 Last data filed at 05/14/2020 1032 Gross per 24 hour  Intake 120 ml  Output 0 ml  Net 120 ml   Filed Weights   05/08/20 0451 05/09/20 0623 05/11/20 0500  Weight: 71 kg 71 kg 70  kg    Examination:  General exam: Ill-appearing, no distress. Respiratory system: Rhonchi in the base. Cardiovascular system: Irregularly irregular and tachycardic. Gastrointestinal system: Abdomen is nondistended, soft and nontender.  Central nervous system: Unresponsive Extremities: No edema.   Data Reviewed: I have personally reviewed following labs and imaging studies  CBC: Recent Labs  Lab  05/08/20 0734 05/09/20 0331 05/10/20 0424  WBC 14.9* 15.4* 14.9*  HGB 12.5* 12.2* 12.7*  HCT 38.6* 38.2* 40.1  MCV 94.1 93.2 94.1  PLT 299 284 332   Basic Metabolic Panel: Recent Labs  Lab 05/08/20 0734 05/09/20 0331 05/10/20 0424 05/12/20 0824 05/13/20 0028  NA 140 141 142 141 142  K 4.5 4.5 4.4 4.3 4.3  CL 104 105 105 104 106  CO2 25 27 22 25 25   GLUCOSE 79 100* 98 98 83  BUN 29* 30* 27* 26* 26*  CREATININE 1.83* 1.60* 1.44* 1.67* 1.62*  CALCIUM 8.8* 8.6* 8.2* 9.0 8.7*  MG 1.9 2.2 2.2 2.2  --    GFR: Estimated Creatinine Clearance: 30 mL/min (A) (by C-G formula based on SCr of 1.62 mg/dL (H)). Liver Function Tests: Recent Labs  Lab 05/08/20 0734 05/09/20 0331 05/10/20 0424  AST 28 25 31   ALT 21 9 <5  ALKPHOS 80 74 68  BILITOT 0.9 0.7 1.0  PROT 6.3* 5.8* 5.5*  ALBUMIN 2.7* 2.6* 2.4*   No results for input(s): LIPASE, AMYLASE in the last 168 hours. No results for input(s): AMMONIA in the last 168 hours. Coagulation Profile: No results for input(s): INR, PROTIME in the last 168 hours. Cardiac Enzymes: No results for input(s): CKTOTAL, CKMB, CKMBINDEX, TROPONINI in the last 168 hours. BNP (last 3 results) No results for input(s): PROBNP in the last 8760 hours. HbA1C: No results for input(s): HGBA1C in the last 72 hours. CBG: Recent Labs  Lab 05/13/20 0541  GLUCAP 77   Lipid Profile: No results for input(s): CHOL, HDL, LDLCALC, TRIG, CHOLHDL, LDLDIRECT in the last 72 hours. Thyroid Function Tests: No results for input(s): TSH, T4TOTAL, FREET4, T3FREE, THYROIDAB in the last 72 hours. Anemia Panel: No results for input(s): VITAMINB12, FOLATE, FERRITIN, TIBC, IRON, RETICCTPCT in the last 72 hours. Sepsis Labs: Recent Labs  Lab 05/10/20 0424 05/12/20 0824 05/13/20 0028  PROCALCITON 30.18 34.70 37.19    Recent Results (from the past 240 hour(s))  SARS Coronavirus 2 by RT PCR (hospital order, performed in [facility] hospital lab) Nasopharyngeal  Nasopharyngeal Swab     Status: None   Collection Time: 05/06/20  4:38 PM   Specimen: Nasopharyngeal Swab  Result Value Ref Range Status   SARS Coronavirus 2 NEGATIVE NEGATIVE Final    Comment: (NOTE) SARS-CoV-2 target nucleic acids are NOT DETECTED.  The SARS-CoV-2 RNA is generally detectable in upper and lower respiratory specimens during the acute phase of infection. The lowest concentration of SARS-CoV-2 viral copies this assay can detect is 250 copies / mL. A negative result does not preclude SARS-CoV-2 infection and should not be used as the sole basis for treatment or other patient management decisions.  A negative result may occur with improper specimen collection / handling, submission of specimen other than nasopharyngeal swab, presence of viral mutation(s) within the areas targeted by this assay, and inadequate number of viral copies (<250 copies / mL). A negative result must be combined with clinical observations, patient history, and epidemiological information.  Fact Sheet for Patients:   StrictlyIdeas.no  Fact Sheet for Healthcare Providers: BankingDealers.co.za  This test is not yet  approved or  cleared by the [place] and has been authorized for detection and/or diagnosis of SARS-CoV-2 by FDA under an Emergency Use Authorization (EUA).  This EUA will remain in effect (meaning this test can be used) for the duration of the COVID-19 declaration under Section 564(b)(1) of the Act, 21 U.S.C. section 360bbb-3(b)(1), unless the authorization is terminated or revoked sooner.  Performed at [facility], [address] [place], [place] [address]   Blood culture (routine x 2)     Status: None   Collection Time: 05/06/20  5:29 PM   Specimen: BLOOD  Result Value Ref Range Status   Specimen Description BLOOD RIGHT HAND  Final   Special Requests   Final    BOTTLES DRAWN AEROBIC AND ANAEROBIC Blood Culture  adequate volume   Culture   Final    NO GROWTH 5 DAYS Performed at [facility], [address] [place], [place] [address]    Report Status 05/11/2020 FINAL  Final  Blood culture (routine x 2)     Status: None   Collection Time: 05/06/20  5:29 PM   Specimen: BLOOD  Result Value Ref Range Status   Specimen Description BLOOD RIGHT FOREARM  Final   Special Requests   Final    BOTTLES DRAWN AEROBIC AND ANAEROBIC Blood Culture results may not be optimal due to an excessive volume of blood received in culture bottles   Culture   Final    NO GROWTH 5 DAYS Performed at [facility], [address], [place], [place] [address]    Report Status 05/11/2020 FINAL  Final  Body fluid culture     Status: None   Collection Time: 05/08/20 11:00 AM   Specimen: PATH Cytology Pleural fluid  Result Value Ref Range Status   Specimen Description   Final    PLEURAL Performed at [facility], [address], [place], [place] [address]    Special Requests   Final    NONE Performed at [facility], [address] [place],  [address]    Gram Stain   Final    MODERATE WBC PRESENT, PREDOMINANTLY MONONUCLEAR NO ORGANISMS SEEN    Culture   Final    NO GROWTH 3 DAYS Performed at [facility], [place] [address], [place],  [address]    Report Status 05/11/2020 FINAL  Final         Radiology Studies: No results found.      Scheduled Meds: . amoxicillin-clavulanate  800 mg Oral Q12H  . feeding supplement (ENSURE ENLIVE)  237 mL Oral TID BM  . QUEtiapine  25 mg Oral QHS   Continuous Infusions: . sodium chloride 10 mL (05/12/20 2143)     LOS: 8 days    Time spent: 28 minutes    Sharen Hones, MD [facility]   To contact the attending provider between 7A-7P or the covering provider during after hours 7P-7A, please log into the web site [email or web address] and access using universal [place] password for that web site. If you  do not have the password, please call the hospital operator.  05/14/2020, 12:41 PM

## 2020-05-14 NOTE — Progress Notes (Addendum)
Counselling psychologist Liaison Note:  New referral for [employer] home received from Palliative NP [place]. TOC Bridget Cobb made aware. Patient information sent to referral. Hospice home eligibility confirmed. Writer met with patient's daughter Staci Righter to initiate education regarding hospice services, philosophy, team approach to care and current visitation policy with understanding voiced.  [facility] is unable to offer a bed today. Butch Penny and TOC Loletha Grayer are aware. Will continue to follow and update family and hospital care team daily with bed availability.  Thank you for the opportunity to be involved in the care of this patient and his family. Flo Shanks BSN, RN, [place] [phone]

## 2020-05-14 NOTE — Progress Notes (Signed)
[place]  [phone] [phone]) [phone]   Name: Mike Smith Date: 05/14/2020 MRN: [id number]  DOB: [birth year]  Patient Care Team: Madelyn Brunner, MD as PCP - General (Internal Medicine)    REASON FOR CONSULTATION: Mike Smith is a 86 y.o. male with multiple medical problems including history of interstitial lung disease not on O2, Parkinson's disease, dementia, CKD 3, who was admitted to the hospital 05/07/2020 with hypoxic respiratory failure.  Patient was found to have multifocal pneumonia with an upper lobe lung mass, probable malignant pleural effusion, and hepatic metastasis on CT.  Patient was also found to have new onset A. fib with RVR.  He is not felt to likely be a candidate for aggressive cancer treatment.  Palliative care was consulted to help address goals..   CODE STATUS: DNR  PAST MEDICAL HISTORY: Past Medical History:  Diagnosis Date   Atrial fibrillation ([place])    Dementia ([place])     PAST SURGICAL HISTORY:  Past Surgical History:  Procedure Laterality Date   SHOULDER SURGERY      HEMATOLOGY/ONCOLOGY HISTORY:  Oncology History   No history exists.    ALLERGIES:  has No Known Allergies.  MEDICATIONS:  Current Facility-Administered Medications  Medication Dose Route Frequency Provider Last Rate Last Admin   0.9 %  sodium chloride infusion   Intravenous PRN Sharen Hones, MD 10 mL/hr at 05/12/20 2143 10 mL at 05/12/20 2143   acetaminophen (TYLENOL) tablet 1,000 mg  1,000 mg Oral Q6H PRN Sharion Settler, NP       amoxicillin-clavulanate (AUGMENTIN) 400-57 MG/5ML suspension 800 mg  800 mg Oral Q12H Oswald Hillock, RPH   800 mg at 05/13/20 2222   feeding supplement (ENSURE ENLIVE) (ENSURE ENLIVE) liquid 237 mL  237 mL Oral TID BM [place] Donnamarie Poag, DO   237 mL at 05/12/20 1000   haloperidol (HALDOL) 2 MG/ML solution 1 mg  1 mg Oral Q6H PRN Naylin Burkle, Kirt Boys, NP         ipratropium-albuterol (DUONEB) 0.5-2.5 (3) MG/3ML nebulizer solution 3 mL  3 mL Nebulization Q6H PRN [place] Eric J, DO       morphine CONCENTRATE 10 MG/0.5ML oral solution 5 mg  5 mg Oral Q2H PRN Emrys Mckamie, Kirt Boys, NP   5 mg at 05/14/20 1315   QUEtiapine (SEROQUEL) tablet 25 mg  25 mg Oral QHS Sharen Hones, MD   25 mg at 05/13/20 2222    VITAL SIGNS: BP (!) 146/95 (BP Location: Left Arm)    Pulse (!) 101    Temp 97.9 F (36.6 C) (Axillary)    Resp 18    Ht 6\' 1"  (1.854 m)    Wt 154 lb 5.2 oz (70 kg)    SpO2 96%    BMI 20.36 kg/m  Filed Weights   05/08/20 0451 05/09/20 0623 05/11/20 0500  Weight: 156 lb 8 oz (71 kg) 156 lb 8.4 oz (71 kg) 154 lb 5.2 oz (70 kg)    Estimated body mass index is 20.36 kg/m as calculated from the following:   Height as of this encounter: 6\' 1"  (1.854 m).   Weight as of this encounter: 154 lb 5.2 oz (70 kg).  LABS: CBC:    Component Value Date/Time   WBC 14.9 (H) 05/10/2020 0424   HGB 12.7 (L) 05/10/2020 0424   HGB 12.7 (L) 08/23/2014 2013   HCT 40.1 05/10/2020 0424   HCT 38.5 (L) 08/23/2014 2013  PLT 246 05/10/2020 0424   PLT 176 08/23/2014 2013   MCV 94.1 05/10/2020 0424   MCV 94 08/23/2014 2013   NEUTROABS 4.5 08/23/2014 2013   LYMPHSABS 1.2 08/23/2014 2013   MONOABS 0.8 08/23/2014 2013   EOSABS 0.3 08/23/2014 2013   BASOSABS 0.0 08/23/2014 2013   Comprehensive Metabolic Panel:    Component Value Date/Time   NA 142 05/13/2020 0028   NA 140 08/23/2014 2013   K 4.3 05/13/2020 0028   K 4.5 08/23/2014 2013   CL 106 05/13/2020 0028   CL 106 08/23/2014 2013   CO2 25 05/13/2020 0028   CO2 28 08/23/2014 2013   BUN 26 (H) 05/13/2020 0028   BUN 18 08/23/2014 2013   CREATININE 1.62 (H) 05/13/2020 0028   CREATININE 1.71 (H) 08/23/2014 2013   GLUCOSE 83 05/13/2020 0028   GLUCOSE 97 08/23/2014 2013   CALCIUM 8.7 (L) 05/13/2020 0028   CALCIUM 8.1 (L) 08/23/2014 2013   AST 31 05/10/2020 0424   AST 24 06/14/2013 1133   ALT <5 05/10/2020  0424   ALT 21 06/14/2013 1133   ALKPHOS 68 05/10/2020 0424   ALKPHOS 94 06/14/2013 1133   BILITOT 1.0 05/10/2020 0424   BILITOT 0.5 06/14/2013 1133   PROT 5.5 (L) 05/10/2020 0424   PROT 7.0 06/14/2013 1133   ALBUMIN 2.4 (L) 05/10/2020 0424   ALBUMIN 3.7 06/14/2013 1133    RADIOGRAPHIC STUDIES: DG Chest 1 View  Result Date: 05/06/2020 CLINICAL DATA:  86 year old male with history of shortness of breath and dyspnea. EXAM: CHEST  1 VIEW COMPARISON:  Chest x-ray 08/23/2014. FINDINGS: Multifocal airspace consolidation and interstitial prominence scattered asymmetrically throughout the lungs bilaterally, most confluent in the left upper lobe medially. Small left pleural effusion. No definite pneumothorax. Pulmonary vasculature is largely obscured. Heart size is normal. IMPRESSION: 1. Findings are concerning for severe multilobar bilateral pneumonia (left greater than right). The possibility of a centrally obstructing mass in the left upper lobe should be considered. Further evaluation with contrast enhanced chest CT could better evaluate these findings if clinically appropriate. Electronically Signed   By: Vinnie Langton M.D.   On: 05/06/2020 17:03   CT Chest W Contrast  Result Date: 05/06/2020 CLINICAL DATA:  Chest pain, dyspnea, episodic apnea EXAM: CT CHEST WITH CONTRAST TECHNIQUE: Multidetector CT imaging of the chest was performed during intravenous contrast administration. CONTRAST:  50mL OMNIPAQUE IOHEXOL 300 MG/ML  SOLN COMPARISON:  None. FINDINGS: Cardiovascular: Global cardiac size within normal limits. Moderate calcification of the aortic valve leaflets. Extensive coronary artery calcification largely within the right coronary artery. No pericardial effusions. Moderate atherosclerotic calcification within the thoracic aorta without evidence of aneurysm. Central pulmonary arteries are of normal caliber. Mediastinum/Nodes: There is extensive pathologic hilar and mediastinal adenopathy.  Probable pathologic bilateral supraclavicular adenopathy. Index subcarinal lymph node measures 3.0 x 4.1 cm at axial image # 72/2 Lungs/Pleura: Moderate centrilobular emphysema. There is, superimposed, extensive multifocal airspace infiltrate within the left upper lobe, primarily, but scattered within the right lower lobe and right middle lobe in keeping with changes of multifocal pneumonia. However, there is, superimposed, a suspected mass within the left upper lobe more peripherally, poorly visualized due to adjacent infiltrate and peripherally collapsed left upper lobe, but roughly measuring 3.6 x 3.7 x 5.1 cm on coronal image # 51/5 and sagittal image # 121/6. There is an associated large left pleural effusion and it is unclear whether this represents a parapneumonic effusion or malignant effusion. Small right pleural effusion. There are  innumerable scattered pulmonary nodules predominantly within the lung bases which are indeterminate, but are suspicious for intrapulmonary metastases. Upper Abdomen: Multiple nodules are seen within the a drawing glands bilaterally which are not well characterized on this examination. There are multiple low-attenuation lesions seen within the liver suspicious for hepatic metastases given the additional findings. Musculoskeletal: Remote compression fracture of T11. No acute bone abnormality. No lytic or blastic bone lesion identified. IMPRESSION: Multifocal pulmonary infiltrates most in keeping with multifocal pneumonia in the acute setting. Large left and small right pleural effusions are noted. Left pleural effusion is indeterminate and may represent either a parapneumonic or malignant effusion. Focal mass within the left upper lobe with obliteration of the a bronchi and postobstructive collapse of the more peripheral left upper lobe, suspicious for a primary bronchogenic neoplasm. Suspected pathologic bilateral supraclavicular, mediastinal, and bilateral hilar adenopathy.  Suspected hepatic metastases. Indeterminate adrenal nodules bilaterally. Repeat staging CT or PET-CT imaging could be performed once the patient's acute issues have resolved. Emphysema. Aortic Atherosclerosis (ICD10-I70.0) and Emphysema (ICD10-J43.9). Electronically Signed   By: Fidela Salisbury MD   On: 05/06/2020 18:46   DG Chest Port 1 View  Result Date: 05/08/2020 CLINICAL DATA:  Status post thoracentesis EXAM: PORTABLE CHEST 1 VIEW COMPARISON:  May 06, 2020 chest radiograph and chest CT. FINDINGS: No appreciable pneumothorax. Left pleural effusion smaller after thoracentesis. There is there is again noted multifocal airspace opacity bilaterally, similar to 2 days prior. Heart is upper normal in size with pulmonary vascularity within normal limits. No adenopathy evident. Degenerative changes noted in the right shoulder with superior migration of the right humeral head. Note colonic interposition between the right hemidiaphragm and liver. IMPRESSION: No appreciable pneumothorax. Multifocal airspace opacity persists, likely due to multifocal pneumonia. Stable cardiac silhouette. Probable chronic rotator cuff tear on the right with superior migration of the right humeral head. Electronically Signed   By: Lowella Grip III M.D.   On: 05/08/2020 11:10   US THORACENTESIS ASP PLEURAL SPACE W/IMG GUIDE  Result Date: 05/08/2020 INDICATION: Patient with history of interstitial lung disease, CKD 3 - currently admitted multifocal pneumonia and large left pleural effusion on CT, request to IR for diagnostic and therapeutic thoracentesis. EXAM: ULTRASOUND GUIDED LEFT THORACENTESIS MEDICATIONS: 7 mL 1% lidocaine COMPLICATIONS: None immediate. PROCEDURE: An ultrasound guided thoracentesis was thoroughly discussed with the patient and questions answered. The benefits, risks, alternatives and complications were also discussed. The patient understands and wishes to proceed with the procedure. Written consent was  obtained. Ultrasound was performed to localize and mark an adequate pocket of fluid in the left chest. The area was then prepped and draped in the normal sterile fashion. 1% Lidocaine was used for local anesthesia. Under ultrasound guidance a 6 Fr Safe-T-Centesis catheter was introduced. Thoracentesis was performed. The catheter was removed and a dressing applied. FINDINGS: A total of approximately 1.4 L of hazy yellow fluid was removed. Samples were sent to the laboratory as requested by the clinical team. IMPRESSION: Successful ultrasound guided left thoracentesis yielding 1.4 L of pleural fluid. Read by Candiss Norse, PA-C Electronically Signed   By: Sandi Mariscal M.D.   On: 05/08/2020 10:52    PERFORMANCE STATUS (ECOG) : 3 - Symptomatic, >50% confined to bed  Review of Systems Unable to provide  Physical Exam General: Frail appearing Pulmonary: Unlabored Extremities: no edema, no joint deformities Skin: no rashes Neurological: poorly responsive  IMPRESSION: Patient is poorly responsive.  He does not appear to be improving through continued antibiotics.  Oral intake is minimal.  I called and spoke with patient's daughter.  She confirmed family decision to pursue residential hospice facility and we discussed what that would entail in detail.  She recognizes that they would focus only on keeping patient comfortable.  It is my understanding that patient has been added to the waiting list for the Hospice Home.  I did discuss with the hospice liaison who will also see the patient.  PLAN: -Best supportive care -Hospice Home when a bed is available   Time Total: 25 minutes  Visit consisted of counseling and education dealing with the complex and emotionally intense issues of symptom management and palliative care in the setting of serious and potentially life-threatening illness.Greater than 50%  of this time was spent counseling and coordinating care related to the above assessment and  plan.  Signed by: Altha Harm, PhD, NP-C

## 2020-05-15 DIAGNOSIS — J910 Malignant pleural effusion: Secondary | ICD-10-CM

## 2020-05-15 MED ORDER — HALOPERIDOL LACTATE 5 MG/ML IJ SOLN
2.5000 mg | Freq: Four times a day (QID) | INTRAMUSCULAR | Status: DC | PRN
  Administered 2020-05-15 (×2): 2.5 mg via INTRAMUSCULAR
  Filled 2020-05-15 (×2): qty 1

## 2020-05-15 MED ORDER — QUETIAPINE FUMARATE 25 MG PO TABS: 25.0000 mg | ORAL_TABLET | Freq: Every day | ORAL | Status: AC

## 2020-05-15 MED ORDER — IPRATROPIUM-ALBUTEROL 0.5-2.5 (3) MG/3ML IN SOLN: 3.0000 mL | Freq: Four times a day (QID) | RESPIRATORY_TRACT | Status: AC | PRN

## 2020-05-15 MED ORDER — HALOPERIDOL LACTATE 5 MG/ML IJ SOLN: 2.5000 mg | Freq: Four times a day (QID) | INTRAMUSCULAR | Status: AC | PRN

## 2020-05-15 MED ORDER — AMOXICILLIN-POT CLAVULANATE 400-57 MG/5ML PO SUSR: 800.0000 mg | Freq: Two times a day (BID) | ORAL | 0 refills | Status: AC

## 2020-05-15 MED ORDER — MORPHINE SULFATE (CONCENTRATE) 10 MG/0.5ML PO SOLN: 5.0000 mg | ORAL | Status: AC | PRN

## 2020-05-15 NOTE — Progress Notes (Addendum)
SLP Cancellation Note  Patient Details Name: Ladarrious Kirksey MRN: [id number] DOB: 08/25/1928   Cancelled treatment:       Reason Eval/Treat Not Completed: Medical issues which prohibited therapy (chart reviewed; consulted Palliative Care, NSG re: POC). Per discussion w/ Palliative Care NSG and chart notes, family has confirmed the family decision to pursue residential hospice facility w/ focus on comfort care. Noted Hospice note. NSG reports that pt is intermittently asking for bites of ice cream, but he is often not arousable to safely be given po's/meds at times. Post prior discussion w/ Palliative Care, the suggestion is no BSE at this time but to continue w/ just the pleasure po's that pt asks for w/ focus on comfort at this time but assure/assess each time that pt is Fully Awake to take po's orally. Recommend oral care for before po's and for pleasure; aspiration precautions w/ all oral intake. Needs can be followed at the [place] as POC focuses on comfort.  ST services can be available for further education while admitted if needed. NSG agreed w/ POC.    Orinda Kenner, MS, CCC-SLP Christabell Loseke 05/15/2020, 12:37 PM

## 2020-05-15 NOTE — Progress Notes (Signed)
Called daughter to provide update on Mike Smith. Communicated that he was able to take 2 bites of vanilla ice cream which he asked for. Unable to arose patient enough to take morning medications so they were not given. Daughter requested that if he was more alert at lunch to try to give antibiotic again and stated they had a family meeting but would be visiting this afternoon.

## 2020-05-15 NOTE — Progress Notes (Signed)
Patient was discharged via EMS. Heart rate elevated, not complaints noted.

## 2020-05-15 NOTE — Progress Notes (Signed)
° °  Chaplain On-Call received referral from [employer], who learned last night that patient's family requests [facility] to provide the [facility] for patient.  Chaplain spoke with RN Opal Sidles and learned there are no procedures scheduled that would impede visit from [place] called [facility], and was assured that Father Eddie Dibbles will come in today to minister to patient. Chaplain gave to [employer] the phone number of patient's son, Mike Smith, so that Mike Smith can notify son of his expected time of hospital visit.  Chaplain will refer to Chaplain Resident [facility] for additional support as needed.  [place] Taniesha Glanz M.Div., [facility]

## 2020-05-15 NOTE — Progress Notes (Signed)
[facility] hospital Liaison note:  [facility] is able to offer a bed today. Family has accepted. Hospital care team notified.  Report called to the hospice home and EMS notified for transport. Thank you. Flo Shanks BSN, RN, [place] [phone]

## 2020-05-15 NOTE — Progress Notes (Signed)
[place]  [phone] [phone]   Name: Mike Smith Date: 05/15/2020 MRN: [id number]  DOB: [birth year]  Patient Care Team: Madelyn Brunner, MD as PCP - General (Internal Medicine)    REASON FOR CONSULTATION: Tarique Loveall is a 86 y.o. male with multiple medical problems including history of interstitial lung disease not on O2, Parkinson's disease, dementia, CKD 3, who was admitted to the hospital 05/07/2020 with hypoxic respiratory failure.  Patient was found to have multifocal pneumonia with an upper lobe lung mass, probable malignant pleural effusion, and hepatic metastasis on CT.  Patient was also found to have new onset A. fib with RVR.  He is not felt to likely be a candidate for aggressive cancer treatment.  Palliative care was consulted to help address goals..   CODE STATUS: DNR  PAST MEDICAL HISTORY: Past Medical History:  Diagnosis Date  . Atrial fibrillation ([place])   . Dementia ([place])     PAST SURGICAL HISTORY:  Past Surgical History:  Procedure Laterality Date  . SHOULDER SURGERY      HEMATOLOGY/ONCOLOGY HISTORY:  Oncology History   No history exists.    ALLERGIES:  has No Known Allergies.  MEDICATIONS:  Current Facility-Administered Medications  Medication Dose Route Frequency Provider Last Rate Last Admin  . 0.9 %  sodium chloride infusion   Intravenous PRN Sharen Hones, MD 10 mL/hr at 05/12/20 2143 10 mL at 05/12/20 2143  . acetaminophen (TYLENOL) tablet 1,000 mg  1,000 mg Oral Q6H PRN Sharion Settler, NP      . amoxicillin-clavulanate (AUGMENTIN) 400-57 MG/5ML suspension 800 mg  800 mg Oral Q12H Oswald Hillock, RPH   800 mg at 05/13/20 2222  . feeding supplement (ENSURE ENLIVE) (ENSURE ENLIVE) liquid 237 mL  237 mL Oral TID BM [place] Eric J, DO   237 mL at 05/14/20 2111  . haloperidol lactate (HALDOL) injection 2.5 mg  2.5 mg Intramuscular Q6H PRN Sharion Settler, NP   2.5 mg at 05/15/20 0053  . ipratropium-albuterol (DUONEB) 0.5-2.5 (3) MG/3ML nebulizer solution 3 mL  3 mL Nebulization Q6H PRN [place] Eric J, DO      . morphine CONCENTRATE 10 MG/0.5ML oral solution 5 mg  5 mg Oral Q2H PRN Marlaina Coburn, Kirt Boys, NP   5 mg at 05/15/20 0356  . QUEtiapine (SEROQUEL) tablet 25 mg  25 mg Oral QHS Sharen Hones, MD   25 mg at 05/14/20 2111    VITAL SIGNS: BP (!) 127/93 (BP Location: Left Arm)   Pulse (!) 49   Temp 98 F (36.7 C) (Oral)   Resp 18   Ht 6\' 1"  (1.854 m)   Wt 154 lb 5.2 oz (70 kg)   SpO2 98%   BMI 20.36 kg/m  Filed Weights   05/08/20 0451 05/09/20 0623 05/11/20 0500  Weight: 156 lb 8 oz (71 kg) 156 lb 8.4 oz (71 kg) 154 lb 5.2 oz (70 kg)    Estimated body mass index is 20.36 kg/m as calculated from the following:   Height as of this encounter: 6\' 1"  (1.854 m).   Weight as of this encounter: 154 lb 5.2 oz (70 kg).  LABS: CBC:    Component Value Date/Time   WBC 14.9 (H) 05/10/2020 0424   HGB 12.7 (L) 05/10/2020 0424   HGB 12.7 (L) 08/23/2014 2013   HCT 40.1 05/10/2020 0424   HCT 38.5 (L) 08/23/2014 2013   PLT 246 05/10/2020 0424  PLT 176 08/23/2014 2013   MCV 94.1 05/10/2020 0424   MCV 94 08/23/2014 2013   NEUTROABS 4.5 08/23/2014 2013   LYMPHSABS 1.2 08/23/2014 2013   MONOABS 0.8 08/23/2014 2013   EOSABS 0.3 08/23/2014 2013   BASOSABS 0.0 08/23/2014 2013   Comprehensive Metabolic Panel:    Component Value Date/Time   NA 142 05/13/2020 0028   NA 140 08/23/2014 2013   K 4.3 05/13/2020 0028   K 4.5 08/23/2014 2013   CL 106 05/13/2020 0028   CL 106 08/23/2014 2013   CO2 25 05/13/2020 0028   CO2 28 08/23/2014 2013   BUN 26 (H) 05/13/2020 0028   BUN 18 08/23/2014 2013   CREATININE 1.62 (H) 05/13/2020 0028   CREATININE 1.71 (H) 08/23/2014 2013   GLUCOSE 83 05/13/2020 0028   GLUCOSE 97 08/23/2014 2013   CALCIUM 8.7 (L) 05/13/2020 0028   CALCIUM 8.1 (L) 08/23/2014 2013   AST 31 05/10/2020 0424   AST 24  06/14/2013 1133   ALT <5 05/10/2020 0424   ALT 21 06/14/2013 1133   ALKPHOS 68 05/10/2020 0424   ALKPHOS 94 06/14/2013 1133   BILITOT 1.0 05/10/2020 0424   BILITOT 0.5 06/14/2013 1133   PROT 5.5 (L) 05/10/2020 0424   PROT 7.0 06/14/2013 1133   ALBUMIN 2.4 (L) 05/10/2020 0424   ALBUMIN 3.7 06/14/2013 1133    RADIOGRAPHIC STUDIES: DG Chest 1 View  Result Date: 05/06/2020 CLINICAL DATA:  86 year old male with history of shortness of breath and dyspnea. EXAM: CHEST  1 VIEW COMPARISON:  Chest x-ray 08/23/2014. FINDINGS: Multifocal airspace consolidation and interstitial prominence scattered asymmetrically throughout the lungs bilaterally, most confluent in the left upper lobe medially. Small left pleural effusion. No definite pneumothorax. Pulmonary vasculature is largely obscured. Heart size is normal. IMPRESSION: 1. Findings are concerning for severe multilobar bilateral pneumonia (left greater than right). The possibility of a centrally obstructing mass in the left upper lobe should be considered. Further evaluation with contrast enhanced chest CT could better evaluate these findings if clinically appropriate. Electronically Signed   By: Vinnie Langton M.D.   On: 05/06/2020 17:03   CT Chest W Contrast  Result Date: 05/06/2020 CLINICAL DATA:  Chest pain, dyspnea, episodic apnea EXAM: CT CHEST WITH CONTRAST TECHNIQUE: Multidetector CT imaging of the chest was performed during intravenous contrast administration. CONTRAST:  62mL OMNIPAQUE IOHEXOL 300 MG/ML  SOLN COMPARISON:  None. FINDINGS: Cardiovascular: Global cardiac size within normal limits. Moderate calcification of the aortic valve leaflets. Extensive coronary artery calcification largely within the right coronary artery. No pericardial effusions. Moderate atherosclerotic calcification within the thoracic aorta without evidence of aneurysm. Central pulmonary arteries are of normal caliber. Mediastinum/Nodes: There is extensive pathologic  hilar and mediastinal adenopathy. Probable pathologic bilateral supraclavicular adenopathy. Index subcarinal lymph node measures 3.0 x 4.1 cm at axial image # 72/2 Lungs/Pleura: Moderate centrilobular emphysema. There is, superimposed, extensive multifocal airspace infiltrate within the left upper lobe, primarily, but scattered within the right lower lobe and right middle lobe in keeping with changes of multifocal pneumonia. However, there is, superimposed, a suspected mass within the left upper lobe more peripherally, poorly visualized due to adjacent infiltrate and peripherally collapsed left upper lobe, but roughly measuring 3.6 x 3.7 x 5.1 cm on coronal image # 51/5 and sagittal image # 121/6. There is an associated large left pleural effusion and it is unclear whether this represents a parapneumonic effusion or malignant effusion. Small right pleural effusion. There are innumerable scattered pulmonary nodules predominantly within  the lung bases which are indeterminate, but are suspicious for intrapulmonary metastases. Upper Abdomen: Multiple nodules are seen within the a drawing glands bilaterally which are not well characterized on this examination. There are multiple low-attenuation lesions seen within the liver suspicious for hepatic metastases given the additional findings. Musculoskeletal: Remote compression fracture of T11. No acute bone abnormality. No lytic or blastic bone lesion identified. IMPRESSION: Multifocal pulmonary infiltrates most in keeping with multifocal pneumonia in the acute setting. Large left and small right pleural effusions are noted. Left pleural effusion is indeterminate and may represent either a parapneumonic or malignant effusion. Focal mass within the left upper lobe with obliteration of the a bronchi and postobstructive collapse of the more peripheral left upper lobe, suspicious for a primary bronchogenic neoplasm. Suspected pathologic bilateral supraclavicular, mediastinal,  and bilateral hilar adenopathy. Suspected hepatic metastases. Indeterminate adrenal nodules bilaterally. Repeat staging CT or PET-CT imaging could be performed once the patient's acute issues have resolved. Emphysema. Aortic Atherosclerosis (ICD10-I70.0) and Emphysema (ICD10-J43.9). Electronically Signed   By: Fidela Salisbury MD   On: 05/06/2020 18:46   DG Chest Port 1 View  Result Date: 05/08/2020 CLINICAL DATA:  Status post thoracentesis EXAM: PORTABLE CHEST 1 VIEW COMPARISON:  May 06, 2020 chest radiograph and chest CT. FINDINGS: No appreciable pneumothorax. Left pleural effusion smaller after thoracentesis. There is there is again noted multifocal airspace opacity bilaterally, similar to 2 days prior. Heart is upper normal in size with pulmonary vascularity within normal limits. No adenopathy evident. Degenerative changes noted in the right shoulder with superior migration of the right humeral head. Note colonic interposition between the right hemidiaphragm and liver. IMPRESSION: No appreciable pneumothorax. Multifocal airspace opacity persists, likely due to multifocal pneumonia. Stable cardiac silhouette. Probable chronic rotator cuff tear on the right with superior migration of the right humeral head. Electronically Signed   By: Lowella Grip III M.D.   On: 05/08/2020 11:10   US THORACENTESIS ASP PLEURAL SPACE W/IMG GUIDE  Result Date: 05/08/2020 INDICATION: Patient with history of interstitial lung disease, CKD 3 - currently admitted multifocal pneumonia and large left pleural effusion on CT, request to IR for diagnostic and therapeutic thoracentesis. EXAM: ULTRASOUND GUIDED LEFT THORACENTESIS MEDICATIONS: 7 mL 1% lidocaine COMPLICATIONS: None immediate. PROCEDURE: An ultrasound guided thoracentesis was thoroughly discussed with the patient and questions answered. The benefits, risks, alternatives and complications were also discussed. The patient understands and wishes to proceed with the  procedure. Written consent was obtained. Ultrasound was performed to localize and mark an adequate pocket of fluid in the left chest. The area was then prepped and draped in the normal sterile fashion. 1% Lidocaine was used for local anesthesia. Under ultrasound guidance a 6 Fr Safe-T-Centesis catheter was introduced. Thoracentesis was performed. The catheter was removed and a dressing applied. FINDINGS: A total of approximately 1.4 L of hazy yellow fluid was removed. Samples were sent to the laboratory as requested by the clinical team. IMPRESSION: Successful ultrasound guided left thoracentesis yielding 1.4 L of pleural fluid. Read by Candiss Norse, PA-C Electronically Signed   By: Sandi Mariscal M.D.   On: 05/08/2020 10:52    PERFORMANCE STATUS (ECOG) : 3 - Symptomatic, >50% confined to bed  Review of Systems Unable to provide  Physical Exam General: Frail appearing Pulmonary: Unlabored Extremities: no edema, no joint deformities Skin: no rashes Neurological: poorly responsive  IMPRESSION: Patient remains poorly responsive. No family currently present. Patient is awaiting a bed at the [facility] with possible transfer later today.  He is currently comfortable appearing.   PLAN: -Best supportive care -Hospice Home when a bed is available   Time Total: 15 minutes  Visit consisted of counseling and education dealing with the complex and emotionally intense issues of symptom management and palliative care in the setting of serious and potentially life-threatening illness.Greater than 50%  of this time was spent counseling and coordinating care related to the above assessment and plan.  Signed by: Altha Harm, PhD, NP-C

## 2020-05-15 NOTE — Discharge Summary (Signed)
Physician Discharge Summary  Patient ID: Mike Smith MRN: [id number] DOB/AGE: 84-Sep-1929 84 y.o.  Admit date: 05/06/2020 Discharge date: 05/15/2020  Admission Diagnoses:  Discharge Diagnoses:  Principal Problem:   Acute respiratory failure with hypoxia ([place]) Active Problems:   Multifocal pneumonia   Mass of upper lobe of left lung   Elevated troponin   Benign essential hypertension   CRF (chronic renal failure), stage 3 (moderate)   ILD (interstitial lung disease) (HCC)   Parkinson disease (H[facility])   Pleural effusion   Hepatic metastases on CT ([place])   New onset atrial fibrillation ([place])   Palliative care encounter   Pressure injury of skin   Protein-calorie malnutrition, severe   Cancer of upper lobe of left lung (H[facility])   Malignant pleural effusion   Discharged Condition: poor  Hospital Course:  Mike Klich Franklinis a 86 y.o.malewith medical history significant forHTN, ILD not on oxygen, Parkinson's disease, dementia, CKD 3, who presents to the emergency room with a several day history of shortness of breath, seen by his PCP at [facility] clinic, where his O2 sat was 86% on room air. CT chest showed multifocal pneumonia, large left and small right pleural effusions, left upper lobe focal mass with postobstructive collapse, mediastinal and bilateral hilar adenopathy and suspected hepatic metastases. Patient was diagnosed with multifocal pneumonia and placed on Rocephin and Zithromax. He was also seen by oncology, no aggressive treatment is recommended. He was also followed by cardiology for increased troponin and atrial fibrillation with RVR.  7/28.1.4 L of fluids was removed yesterday by thoracentesis. Initial work-up suggest malignant pleural effusion. Completed 3 days of Zithromax.Patient has profound agitation and tachycardia. Receiving Haldol. Also added 25 mg of Seroquel every evening for poor sleep at nighttime.  7/29. Patient is less  agitated today. Heart rate is slightly better. Diltiazem was added yesterday which reduce the heart rate to 60s, change dose to 60 mg 3 times a day. Profound elevation of procalcitonin level, antibiotic changed to Zosyn for aspiration pneumonia.  7/30. Patient seemed to be doing better. Not able to give a diltiazem as patient heart rate was better and the blood pressure was soft. Continue Zosyn for another day. Patient has a poor p.o. intake.  7/31.Procalcitonin level is not better on Zosyn. Changed to meropenem after discussion with patient son.  8/1.Discussed with patient daughter, patient treatment is futile. Hislife expectancy probably isless than 1 week.   She has been waiting for bed in inpatient hospice, will be transferred today.   Consults:   Significant Diagnostic Studies:   Treatments: antibiotics: Zosyn  Discharge Exam: Blood pressure (!) 127/93, pulse (!) 49, temperature 98 F (36.7 C), temperature source Oral, resp. rate 18, height 6\' 1"  (1.854 m), weight 70 kg, SpO2 98 %. General appearance: Unresponsive Resp: Rhonchi in the base, could not clear upper airway. Cardio: Tachycardic, irregularly irregular GI: soft, non-tender; bowel sounds normal; no masses,  no organomegaly Extremities: No edema  Disposition: Discharge disposition: 51-Hospice/Medical Facility       Discharge Instructions    Diet - low sodium heart healthy   Complete by: As directed    Discharge wound care:   Complete by: As directed    Comfort care   Increase activity slowly   Complete by: As directed      Allergies as of 05/15/2020   No Known Allergies     Medication List    STOP taking these medications   alfuzosin 10 MG 24 hr tablet Commonly known as: UROXATRAL  atorvastatin 20 MG tablet Commonly known as: LIPITOR   carbidopa-levodopa 25-100 MG tablet Commonly known as: SINEMET IR   cetirizine 10 MG tablet Commonly known as: ZYRTEC   donepezil 5 MG  tablet Commonly known as: ARICEPT   DULoxetine 30 MG capsule Commonly known as: CYMBALTA   HYDROcodone-acetaminophen 5-325 MG tablet Commonly known as: NORCO/VICODIN   Multi-Vitamin tablet     TAKE these medications   acetaminophen 650 MG CR tablet Commonly known as: TYLENOL Take 1,300 mg by mouth every 12 (twelve) hours as needed for Pain   amoxicillin-clavulanate 400-57 MG/5ML suspension Commonly known as: AUGMENTIN Take 10 mLs (800 mg total) by mouth every 12 (twelve) hours.   haloperidol lactate 5 MG/ML injection Commonly known as: HALDOL Inject 0.5 mLs (2.5 mg total) into the muscle every 6 (six) hours as needed.   ipratropium-albuterol 0.5-2.5 (3) MG/3ML Soln Commonly known as: DUONEB Take 3 mLs by nebulization every 6 (six) hours as needed.   morphine CONCENTRATE 10 MG/0.5ML Soln concentrated solution Take 0.25 mLs (5 mg total) by mouth every 2 (two) hours as needed for severe pain or shortness of breath.   QUEtiapine 25 MG tablet Commonly known as: SEROQUEL Take 1 tablet (25 mg total) by mouth at bedtime.            Discharge Care Instructions  (From admission, onward)         Start     Ordered   05/15/20 0000  Discharge wound care:       Comments: Comfort care   05/15/20 1352           Signed: Sharen Hones 05/15/2020, 1:52 PM

## 2020-05-17 LAB — COMP PANEL: LEUKEMIA/LYMPHOMA

## 2020-06-13 DEATH — deceased

## 2021-09-23 IMAGING — CT CT CHEST W/ CM
2 of 3 series · 14 of 36 positions shown, 17 images · IV contrast (omnipaque)
Comparison: None.

CLINICAL DATA: Chest pain, dyspnea, episodic apnea

EXAM:
CT CHEST WITH CONTRAST
TECHNIQUE: Multidetector CT imaging of the chest was performed during
intravenous contrast administration.
CONTRAST:  60mL OMNIPAQUE IOHEXOL 300 MG/ML  SOLN

[Series 2: axial st · axial · 0.76mm/px · z∈[-816,-566]mm · 11 of 147 slices shown, 14 images]
[im 11/147  mediastinal]
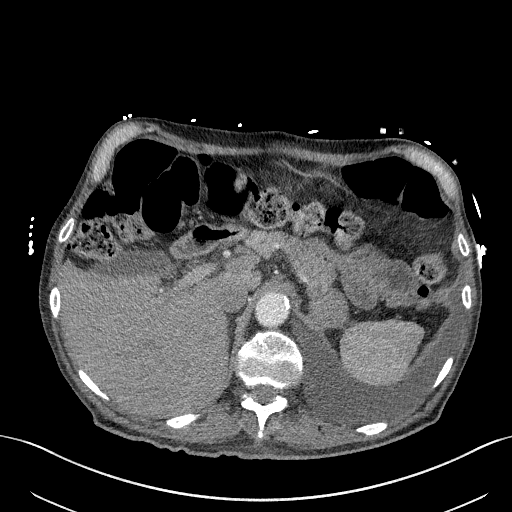
[im 11/147  lung]
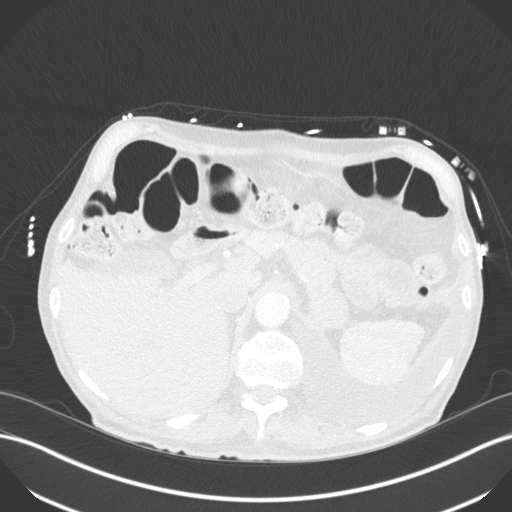
[im 22/147  lung]
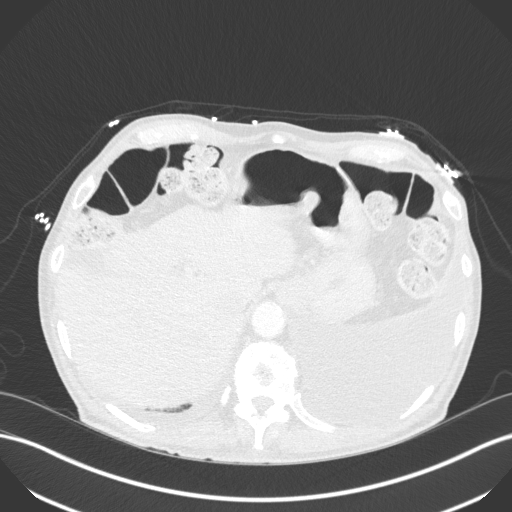
[im 33/147  lung]
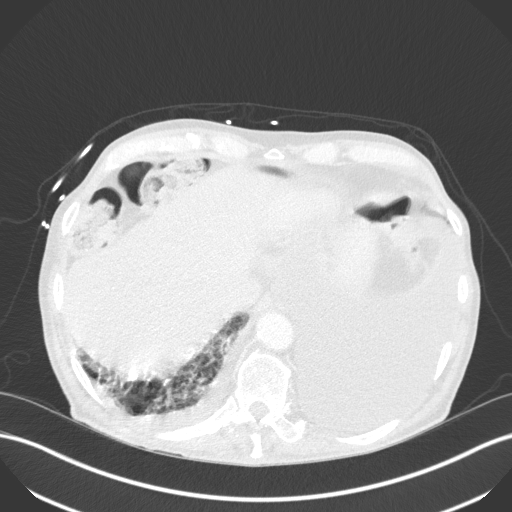
[im 49/147  lung]
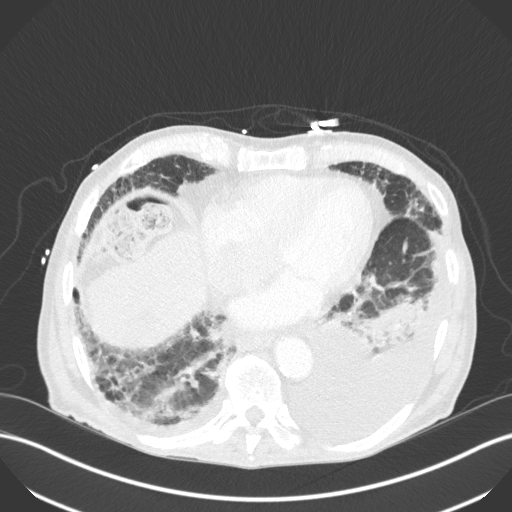
[im 60/147  mediastinal]
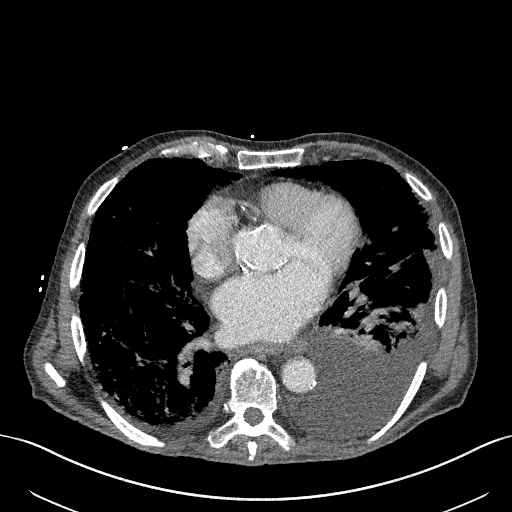
[im 60/147  lung]
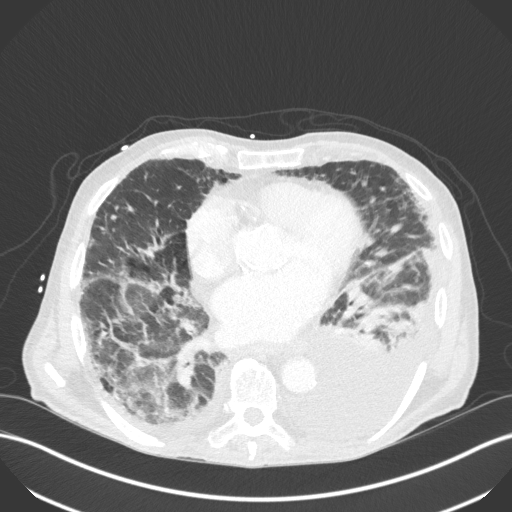
[im 76/147  lung]
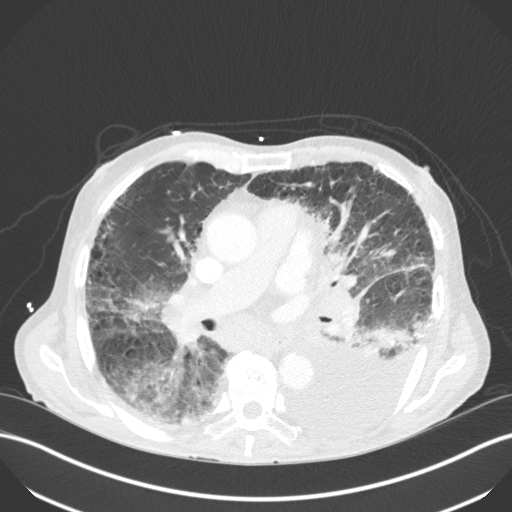
[im 87/147  lung]
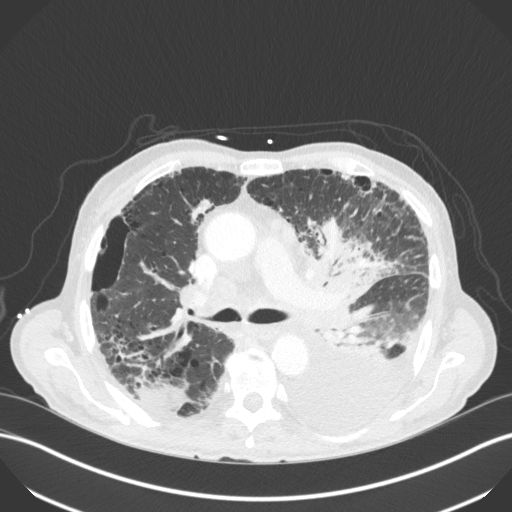
[im 98/147  lung]
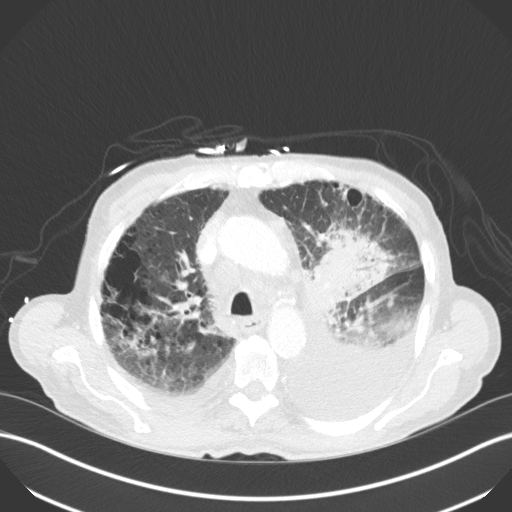
[im 114/147  mediastinal]
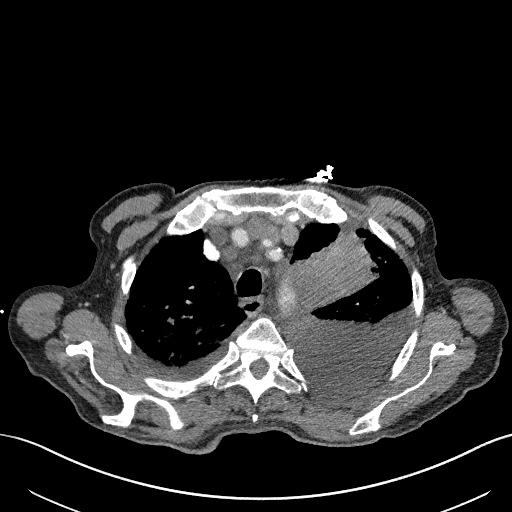
[im 114/147  lung]
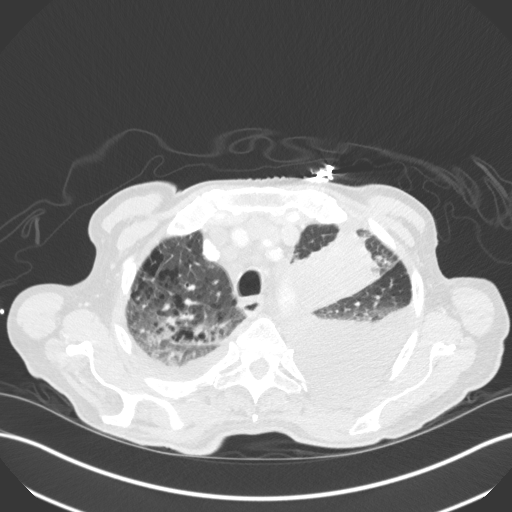
[im 125/147  lung]
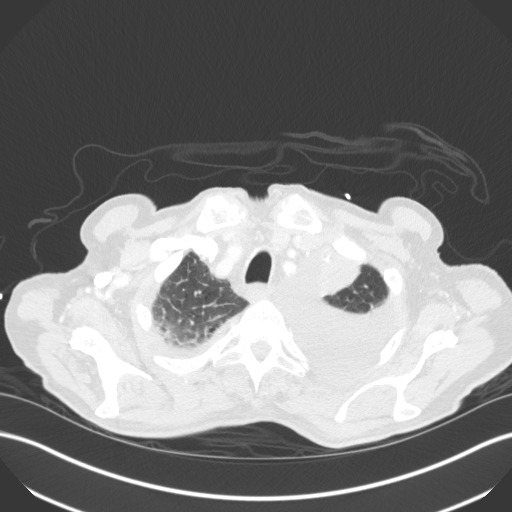
[im 136/147  lung]
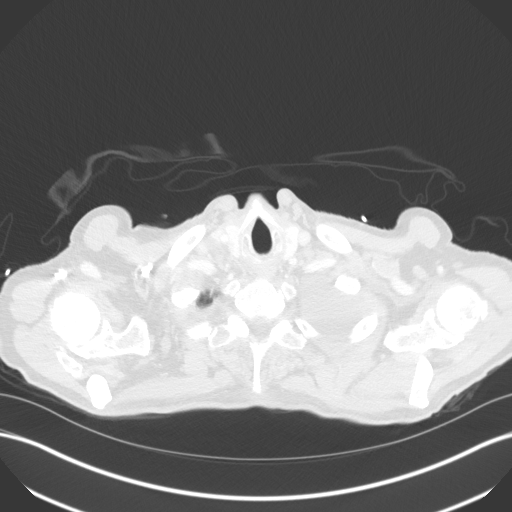

[Series 5: coronal · coronal · 0.59mm/px · 3 of 134 slices shown]
[im 27/134  lung]
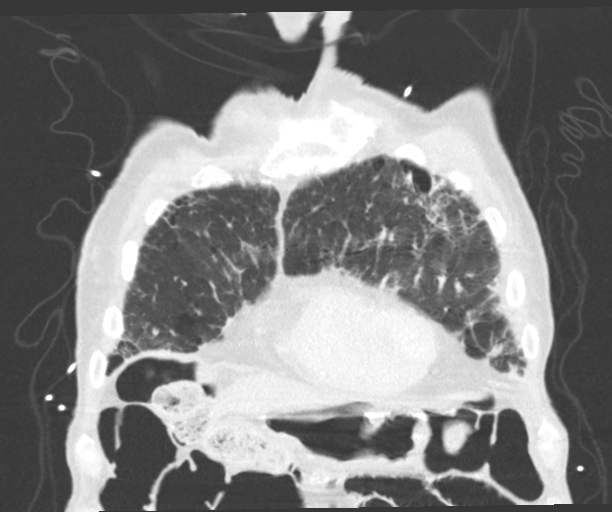
[im 54/134  lung]
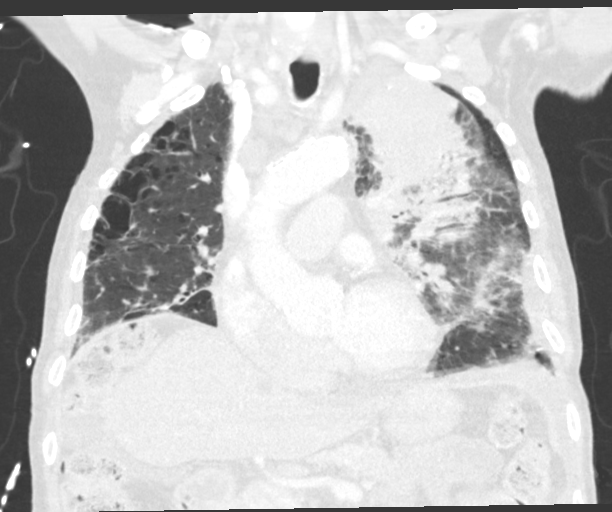
[im 80/134  lung]
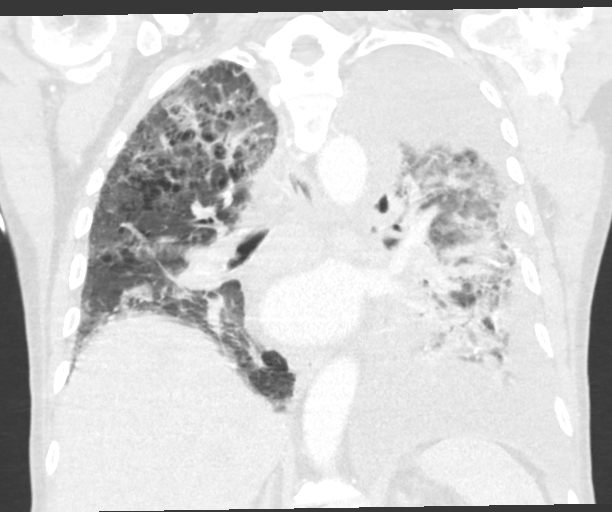

[14 of 36 positions shown; findings below may reference images not displayed]

FINDINGS: Cardiovascular: Global cardiac size within normal limits. Moderate
calcification of the aortic valve leaflets. Extensive coronary
artery calcification largely within the right coronary artery. No
pericardial effusions. Moderate atherosclerotic calcification within
the thoracic aorta without evidence of aneurysm. Central pulmonary
arteries are of normal caliber.

Mediastinum/Nodes: There is extensive pathologic hilar and
mediastinal adenopathy. Probable pathologic bilateral
supraclavicular adenopathy. Index subcarinal lymph node measures
x 4.1 cm at axial image # 72/2

Lungs/Pleura: Moderate centrilobular emphysema. There is,
superimposed, extensive multifocal airspace infiltrate within the
left upper lobe, primarily, but scattered within the right lower
lobe and right middle lobe in keeping with changes of multifocal
pneumonia.

However, there is, superimposed, a suspected mass within the left
upper lobe more peripherally, poorly visualized due to adjacent
infiltrate and peripherally collapsed left upper lobe, but roughly
measuring 3.6 x 3.7 x 5.1 cm on coronal image # 51/5 and sagittal
image # 121/6. There is an associated large left pleural effusion
and it is unclear whether this represents a parapneumonic effusion
or malignant effusion. Small right pleural effusion. There are
innumerable scattered pulmonary nodules predominantly within the
lung bases which are indeterminate, but are suspicious for
intrapulmonary metastases.

Upper Abdomen: Multiple nodules are seen within the a drawing glands
bilaterally which are not well characterized on this examination.
There are multiple low-attenuation lesions seen within the liver
suspicious for hepatic metastases given the additional findings.

Musculoskeletal: Remote compression fracture of T11. No acute bone
abnormality. No lytic or blastic bone lesion identified.
IMPRESSION: Multifocal pulmonary infiltrates most in keeping with multifocal
pneumonia in the acute setting. Large left and small right pleural
effusions are noted. Left pleural effusion is indeterminate and may
represent either a parapneumonic or malignant effusion.

Focal mass within the left upper lobe with obliteration of the a
bronchi and postobstructive collapse of the more peripheral left
upper lobe, suspicious for a primary bronchogenic neoplasm.
Suspected pathologic bilateral supraclavicular, mediastinal, and
bilateral hilar adenopathy. Suspected hepatic metastases.
Indeterminate adrenal nodules bilaterally. Repeat staging CT or
PET-CT imaging could be performed once the patient's acute issues
have resolved.

Emphysema.

Aortic Atherosclerosis (LGRY6-EP7.7) and Emphysema (LGRY6-K88.U).
